# Patient Record
Sex: Female | Born: 1965 | Race: White | Hispanic: No | Marital: Single | Smoking: Former smoker
Health system: Southern US, Community
[De-identification: ages and names within clinical notes are randomized; demographics above are authoritative.]

## PROBLEM LIST (undated history)

## (undated) DIAGNOSIS — A159 Respiratory tuberculosis unspecified: Secondary | ICD-10-CM

## (undated) DIAGNOSIS — L409 Psoriasis, unspecified: Secondary | ICD-10-CM

## (undated) DIAGNOSIS — E039 Hypothyroidism, unspecified: Secondary | ICD-10-CM

## (undated) DIAGNOSIS — R600 Localized edema: Secondary | ICD-10-CM

## (undated) DIAGNOSIS — R5383 Other fatigue: Secondary | ICD-10-CM

## (undated) DIAGNOSIS — J449 Chronic obstructive pulmonary disease, unspecified: Secondary | ICD-10-CM

## (undated) DIAGNOSIS — M2550 Pain in unspecified joint: Secondary | ICD-10-CM

## (undated) DIAGNOSIS — I471 Supraventricular tachycardia, unspecified: Secondary | ICD-10-CM

## (undated) DIAGNOSIS — E785 Hyperlipidemia, unspecified: Secondary | ICD-10-CM

## (undated) DIAGNOSIS — M1990 Unspecified osteoarthritis, unspecified site: Secondary | ICD-10-CM

## (undated) HISTORY — DX: Supraventricular tachycardia: I47.1

## (undated) HISTORY — PX: LUMBAR DISC SURGERY: SHX700

## (undated) HISTORY — DX: Psoriasis, unspecified: L40.9

## (undated) HISTORY — PX: COLONOSCOPY: SHX174

## (undated) HISTORY — DX: Other fatigue: R53.83

## (undated) HISTORY — DX: Supraventricular tachycardia, unspecified: I47.10

## (undated) HISTORY — DX: Hyperlipidemia, unspecified: E78.5

## (undated) HISTORY — DX: Localized edema: R60.0

## (undated) HISTORY — PX: OTHER SURGICAL HISTORY: SHX169

## (undated) HISTORY — PX: ENDOMETRIAL ABLATION: SHX621

## (undated) HISTORY — PX: ANKLE SURGERY: SHX546

## (undated) HISTORY — DX: Pain in unspecified joint: M25.50

---

## 2005-05-25 ENCOUNTER — Ambulatory Visit: Payer: Self-pay | Admitting: Anesthesiology

## 2005-05-26 ENCOUNTER — Ambulatory Visit: Payer: Self-pay | Admitting: Anesthesiology

## 2005-07-02 ENCOUNTER — Ambulatory Visit: Payer: Self-pay | Admitting: Anesthesiology

## 2005-08-27 ENCOUNTER — Ambulatory Visit: Payer: Self-pay | Admitting: Anesthesiology

## 2005-10-28 ENCOUNTER — Ambulatory Visit (HOSPITAL_COMMUNITY): Admission: RE | Admit: 2005-10-28 | Discharge: 2005-10-29 | Payer: Self-pay | Admitting: Neurosurgery

## 2008-04-18 ENCOUNTER — Encounter: Admission: RE | Admit: 2008-04-18 | Discharge: 2008-04-18 | Payer: Self-pay | Admitting: Internal Medicine

## 2010-08-03 ENCOUNTER — Encounter: Payer: Self-pay | Admitting: Internal Medicine

## 2010-11-28 NOTE — Op Note (Signed)
Cynthia Obrien, Cynthia Obrien              ACCOUNT NO.:  [id number]   MEDICAL RECORD NO.:  [id number]          PATIENT TYPE:  OIB   LOCATION:  3014                         FACILITY:  [facility]   PHYSICIAN:  Cristi Loron, M.D.DATE OF BIRTH:  02/17/1966   DATE OF PROCEDURE:  10/29/2005  DATE OF DISCHARGE:  10/29/2005                                 OPERATIVE REPORT   BRIEF HISTORY:  The patient is a 45 year old white female who has suffered  from back and left leg pain.  She failed medical management and was worked  up with a lumbar MRI, which demonstrated a herniated disk at L5-S1 on the  left.  I discussed the various treatment options with the patient including  surgery.  The patient has weighed the risks, benefits, and alternatives of  surgery and decided to proceed with a left L5-S1 microdiskectomy.   PREOPERATIVE DIAGNOSES:  L5-S1 herniated nucleus pulposus, stenosis,  degenerative disk disease, lumbar radiculopathy and lumbago.   POSTOPERATIVE DIAGNOSES:  L5-S1 herniated nucleus pulposus, stenosis,  degenerative disk disease, lumbar radiculopathy and lumbago.   PROCEDURE:  Left L5-S1 microdiskectomy using microdissection.   SURGEON:  Cristi Loron, M.D.   ASSISTANT:  Hewitt Shorts, M.D.   ANESTHESIA:  General endotracheal.   ESTIMATED BLOOD LOSS:  50 mL.   SPECIMENS:  None.   DRAINS:  None.   COMPLICATIONS:  None.   DESCRIPTION OF PROCEDURE:  The patient was brought to the operating room by  the anesthesia team and general endotracheal anesthesia was induced.  The  patient was turned to the prone position on the Wilson frame.  Her  lumbosacral region was then prepared with Betadine scrub and Betadine  solution and sterile drapes were applied.  I then injected the area to be  incised with Marcaine with epinephrine solution.  I used a scalpel to make a  linear midline incision over the L5-S1 interspace.  I used electrocautery to  perform a left-sided  subperiosteal dissection, exposing the spinous  processes and lamina of L5 and S1.  We then obtained the intraoperative  radiograph to confirm our location.  We then inserted the [facility]  retractor for exposure and then we brought the operating microscope into the  field and under its magnification and illumination, we completed the  microdissection/decompression.  We used the high-speed drill to perform a  left L5 laminotomy.  I widened the laminotomy with the Kerrison punch,  removing the left L5-S1 ligamentum flavum.  I performed a foraminotomy about  the left S1 nerve root and then used microdissection to free up the nerve  root from the epidural tissue and then Dr. Newell Coral carefully retracted the  left S1 nerve root and thecal sac medially, exposing the underlying  herniated disk.  There was a free-fragment disk herniation which had  migrated caudally from the disk space.  I used microdissection to free up  this disk fragment and then removed it using pituitary forceps.  We then  inspected the intervertebral disk and noted there was bulging and there was  a midline bone spur, but there was no disk herniation  per se, and we then  palpated along the ventral surface of the thecal sac along the exit route of  the left S1 nerve root and noted that the neural structures were well-  decompressed.  We therefore did not violate the annulus fibrosus nor enter  into the intervertebral disk space.  I then obtained stringent hemostasis  using bipolar electrocautery, irrigated the wound out with bacitracin  solution and then removed the McCullough retractor.  We then reapproximated  the patient's thoracolumbar fascia with interrupted #1 Vicryl suture, the  subcutaneous tissue with interrupted 2-0 Vicryl suture, and the skin with  Steri-Strips and Benzoin.  The wound was then coated with bacitracin  ointment and a sterile dressing applied.  The drapes were removed and the  patient was  subsequently returned to a supine position, where she was  extubated by the anesthesia team and transported to the postanesthesia care  unit in stable condition.  All sponge, instrument and needle counts were  correct at the end of this case.      Cristi Loron, M.D.  Electronically Signed     JDJ/MEDQ  D:  10/29/2005  T:  10/30/2005  Job:  [id number]

## 2010-11-28 NOTE — Op Note (Signed)
NAMEAMARA, Cynthia Obrien              ACCOUNT NO.:  [id number]   MEDICAL RECORD NO.:  [id number]          PATIENT TYPE:  OIB   LOCATION:  3014                         FACILITY:  [facility]   PHYSICIAN:  Cristi Loron, M.D.DATE OF BIRTH:  [birth year]   DATE OF PROCEDURE:  DATE OF DISCHARGE:  10/29/2005                                 OPERATIVE REPORT   ADDENDUM:  We did incise the annulus fibrosis and performed a partial intervertebral  diskectomy using pituitary forceps and  curettes.  After we were satisfied  with the intervertebral diskectomy, we then palpated long the ventral  surface of the thecal sac and along the exit route  of the L5 and S1 nerve  root and noted they were well decompressed.      Cristi Loron, M.D.  Electronically Signed     JDJ/MEDQ  D:  10/29/2005  T:  10/30/2005  Job:  [id number]

## 2011-09-29 ENCOUNTER — Encounter (HOSPITAL_COMMUNITY): Payer: Self-pay | Admitting: Emergency Medicine

## 2011-09-29 ENCOUNTER — Emergency Department (HOSPITAL_COMMUNITY): Payer: 59

## 2011-09-29 ENCOUNTER — Other Ambulatory Visit: Payer: Self-pay

## 2011-09-29 ENCOUNTER — Emergency Department (HOSPITAL_COMMUNITY)
Admission: EM | Admit: 2011-09-29 | Discharge: 2011-09-29 | Disposition: A | Discharge status: Home Health | Payer: 59 | Attending: Emergency Medicine | Admitting: Emergency Medicine

## 2011-09-29 DIAGNOSIS — I498 Other specified cardiac arrhythmias: Secondary | ICD-10-CM | POA: Insufficient documentation

## 2011-09-29 DIAGNOSIS — J4489 Other specified chronic obstructive pulmonary disease: Secondary | ICD-10-CM | POA: Insufficient documentation

## 2011-09-29 DIAGNOSIS — M7989 Other specified soft tissue disorders: Secondary | ICD-10-CM | POA: Insufficient documentation

## 2011-09-29 DIAGNOSIS — Z79899 Other long term (current) drug therapy: Secondary | ICD-10-CM | POA: Insufficient documentation

## 2011-09-29 DIAGNOSIS — R079 Chest pain, unspecified: Secondary | ICD-10-CM | POA: Insufficient documentation

## 2011-09-29 DIAGNOSIS — Z7982 Long term (current) use of aspirin: Secondary | ICD-10-CM | POA: Insufficient documentation

## 2011-09-29 DIAGNOSIS — R0602 Shortness of breath: Secondary | ICD-10-CM | POA: Insufficient documentation

## 2011-09-29 DIAGNOSIS — R5381 Other malaise: Secondary | ICD-10-CM | POA: Insufficient documentation

## 2011-09-29 DIAGNOSIS — I471 Supraventricular tachycardia: Secondary | ICD-10-CM

## 2011-09-29 DIAGNOSIS — J449 Chronic obstructive pulmonary disease, unspecified: Secondary | ICD-10-CM | POA: Insufficient documentation

## 2011-09-29 DIAGNOSIS — R002 Palpitations: Secondary | ICD-10-CM | POA: Insufficient documentation

## 2011-09-29 HISTORY — DX: Hypothyroidism, unspecified: E03.9

## 2011-09-29 HISTORY — DX: Chronic obstructive pulmonary disease, unspecified: J44.9

## 2011-09-29 LAB — CBC
Hemoglobin: 14.6 g/dL (ref 12.0–15.0)
MCH: 32.9 pg (ref 26.0–34.0)
MCHC: 34.1 g/dL (ref 30.0–36.0)
Platelets: 269 10*3/uL (ref 150–400)

## 2011-09-29 LAB — DIFFERENTIAL
Basophils Absolute: 0 10*3/uL (ref 0.0–0.1)
Basophils Relative: 0 % (ref 0–1)
Eosinophils Absolute: 0.1 10*3/uL (ref 0.0–0.7)
Neutro Abs: 4.9 10*3/uL (ref 1.7–7.7)
Neutrophils Relative %: 68 % (ref 43–77)

## 2011-09-29 LAB — TROPONIN I
Troponin I: <0.3 ng/mL (ref <0.30)
Troponin I: <0.3 ng/mL (ref <0.30)

## 2011-09-29 LAB — BASIC METABOLIC PANEL
Chloride: 104 mEq/L (ref 96–112)
GFR calc Af Amer: 83 mL/min — ABNORMAL LOW (ref >90)
GFR calc non Af Amer: 72 mL/min — ABNORMAL LOW (ref >90)
Potassium: 3.9 mEq/L (ref 3.5–5.1)
Sodium: 141 mEq/L (ref 135–145)

## 2011-09-29 MED ORDER — ASPIRIN 81 MG PO CHEW
324.0000 mg | CHEWABLE_TABLET | Freq: Once | ORAL | Status: AC
  Administered 2011-09-29: 243 mg via ORAL
  Filled 2011-09-29: qty 3

## 2011-09-29 MED ORDER — ADENOSINE 6 MG/2ML IV SOLN
6.0000 mg | Freq: Once | INTRAVENOUS | Status: AC
  Administered 2011-09-29: 6 mg via INTRAVENOUS

## 2011-09-29 MED ORDER — NITROGLYCERIN 0.4 MG SL SUBL
0.4000 mg | SUBLINGUAL_TABLET | SUBLINGUAL | Status: DC | PRN
  Administered 2011-09-29 (×2): 0.4 mg via SUBLINGUAL

## 2011-09-29 MED ORDER — SODIUM CHLORIDE 0.9 % IV BOLUS (SEPSIS)
1000.0000 mL | Freq: Once | INTRAVENOUS | Status: AC
  Administered 2011-09-29: 1000 mL via INTRAVENOUS

## 2011-09-29 MED ORDER — ADENOSINE 6 MG/2ML IV SOLN
INTRAVENOUS | Status: AC
  Filled 2011-09-29: qty 6

## 2011-09-29 NOTE — ED Notes (Signed)
Pt placed on on cardiac monitor and zole pads in place. Pt reports that she is still having pain in her chest over the left breast and rates at 3/10.

## 2011-09-29 NOTE — Discharge Instructions (Signed)
Ms Arie[facility] cardiology with call you in the AM with an appointment time.  Return to ER for any chest pain or other concerns for rapid heart rate.    Arrhythmia Categories Your heart is a muscle that works to pump blood through your body by regular contractions. The beating of your heart is controlled by a system of special pacemaker cells. These cells control the electrical activity of the heart. When the system controlling this regular beating is disturbed, a heart rhythm abnormality (arrhythmia) results. WHEN YOUR HEART SKIPS A BEAT One of the most common and least serious heart arrhythmias is called an ectopic or premature atrial heartbeat (PAC). This may be noticed as a small change in your regular pulse. A PAC originates from the top part (atrium) of the heart. Within the right atrium, the SA node is the area that normally controls the regularity of the heart. PACs occur in heart tissue outside of the SA node region. You may feel this as a skipped beat or heart flutter, especially if several occur in succession or occur frequently.  Another arrhythmia is ventricular premature complex (VCP or PVC). These extra beats start out in the bottom, more muscular chambers of the heart. In most cases a PVC is harmless. If there are underlying causes that are making the heart irritable such as an overactive thyroid or a prior heart attack PVCs may be of more concern. In a few cases, medications to control the heart rhythm may be prescribed. Things to try at home:  Cut down or avoid alcohol, tobacco and caffeine.   Get enough sleep.   Reduce stress.   Exercise more.  WHEN THE HEART BEATS TOO FAST Atrial tachycardia is a fast heart rate, which starts out in the atrium. It may last from minutes to much longer. Your heart may beat 140 to 240 times per minute instead of the normal 60 to 100.  Symptoms include a worried feeling (anxiety) and a sense that your heart is beating fast and hard.   You may  be able to stop the fast rate by holding your breath or bearing down as if you were going to have a bowel movement.   This type of fast rate is usually not dangerous.  Atrial fibrillation and atrial flutter are other fast rhythms that start in the atria. Both conditions keep the atria from filling with enough blood so the heart does not work well.  Symptoms include feeling lightheaded or faint.   These fast rates may be the result of heart damage or disease. Too much thyroid hormone may play a role.   There may be no clear cause or it may be from heart disease or damage.   Medication or a special electrical treatment (cardioversion) may be needed to get the heart beating normally.  Ventricular tachycardia is a fast heart rate that starts in the lower muscular chambers (ventricles) This is a serious disorder that requires treatment as soon as possible. You need someone else to get and use a small defibrillator.  Symptoms include collapse, chest pain, or being short of breath.   Treatment may include medication, procedures to improve blood flow to the heart, or an implantable cardiac defibrillator (ICD).  DIAGNOSIS   A cardiogram (EKG or ECG) will be done to see the arrhythmia, as well as lab tests to check the underlying cause.   If the extra beats or fast rate come and go, you may wear a Holter monitor that records your heart  rate for a longer period of time.  HOME CARE INSTRUCTIONS  If your caregiver has given you a follow-up appointment, it is very important to keep that appointment. Not keeping the appointment could result in a heart attack, permanent injury, pain, and disability. If there is any problem keeping the appointment, you must call back to this facility for assistance.  SEEK MEDICAL CARE IF:  You have irregular or fast heartbeats (palpitations).   You experience skipped beats.   You develop lightheadedness.   You have chest discomfort.   You develop shortness of  breath.   You have more frequent episodes, if you are already being treated.  SEEK IMMEDIATE MEDICAL CARE IF:   You have severe chest pain, especially if the pain is crushing or pressure-like and spreads to the arms, back, neck, or jaw, or if you have sweating, feeling sick to your stomach (nausea), or shortness of breath. THIS IS AN EMERGENCY. Do not wait to see if the pain will go away. Get medical help at once. Call 911 or 0 (operator). DO NOT drive yourself to the hospital.   You feel dizzy or faint.   You have episodes of previously documented atrial tachycardia that do not resolve with the techniques your caregiver has taught you.   Irregular or rapid heartbeats begin to occur more often than in the past, especially if they are associated with more pronounced symptoms or of longer duration.  Document Released: 06/29/2005 Document Revised: 06/18/2011 Document Reviewed: 11/11/2006 [facility] Patient Information 2012 [place], [place].Cardiac Ablation Cardiac means related to the heart. Ablation means destruction. Cardiac ablation is a procedure to disable a small amount of heart tissue in very specific places. The heart has many electrical connections. Sometimes these connections can become abnormal and can cause the heart to beat very fast or irregularly. By disabling some of the problem areas, the heart rhythm can be improved or made normal. Ablation is done for people who:  Have Wolf-Parkinson-White syndrome.   Have other fast heart rhythms known as tachycardia.   Have tried medications for an abnormal heart rhythm that resulted in:   No success.   Side effects.   May have a high risk heart beat (arrhythmia) that could result in death.  LET YOUR CAREGIVER KNOW ABOUT:   All previous heart procedures, such as:   Surgeries.   Tests.   Heart conditions.   Allergies or previous reactions to:   Food.   Medicine.   Tape.  RISKS AND COMPLICATIONS  Depending on how long it  takes to do the ablation, the dose of radiation can be high. This can increase the risk of cancer.   Bruising and bleeding where the catheter was inserted.   Bleeding into the chest, especially into the sack that surrounds the heart, is a serious complication that sometimes occurs.   A permanent pacemaker may be needed if the normal electrical system is damaged.   The procedure may not be fully effective, and this may not be recognized for months. Repeat ablations are sometimes required.  BEFORE THE PROCEDURE   Let your caregiver know if:   You have an allergy to X-ray dye or seafood.   If you have kidney failure.   If you have had a kidney transplant.   Follow your caregiver's instructions regarding eating and drinking before the procedure.   Take your medications as directed at regular times with water unless instructed otherwise. If you are on insulin, ask how you are to take it  and if there are special instructions. It is common to adjust insulin dosing the day of the ablation.  PROCEDURE  An ablation is usually performed in a catheterization laboratory with the guidance of fluoroscopy. Fluoroscopy is a type of X-ray that helps your caregiver see images of your heart during the procedure.   An IV will be started before the procedure begins. You will be given a sedative to help you relax.   An ablation is a minimally invasive procedure. This means a small cut (incision) is made in either your neck or groin. Your caregiver will decide where to make the incision based on your medical history and physical exam.   The skin on your neck or groin will be numbed. A needle will be inserted into a large vein in your neck or groin and a thin, flexible tube called a catheter will be threaded to your heart.   A special dye that shows up on fluoroscopy pictures may be injected through the catheter. The dye helps your caregiver see the area of the heart that needs treatment.   The catheter has  electrodes on the tip. When the area of heart tissue that is causing the abnormal heart rhythm is found, the catheter tip will send an electrical current to the area and "scar" the tissue.   Three types of energy can be used to ablate the heart tissue:   Heat (radiofrequency energy).   Laser energy.   Extreme cold (cryoablation).   When the area of the heart has been ablated, the catheter will be taken out. Pressure will be held on the insertion site. This will help the insertion site clot and keep it from bleeding. A bandage will be placed on the insertion site.   An ablation procedure can take 1 to 6 hours to complete.  AFTER THE PROCEDURE   After the procedure, you will be taken to a recovery area where your vital signs (blood pressure, heart rate and breathing) will be monitored. The insertion site will also be monitored for bleeding.   You will need to lie still for 4 to 6 hours. This is to ensure you do not bleed from the catheter insertion site.   If your blood pressure and heart rate are stable and no bleeding occurs at the insertion site, you may go home the same day as your procedure.   If complications occur or your caregiver feels you should be watched, you may need to stay in the hospital overnight.  HOME CARE INSTRUCTIONS   The day following the procedure, the bandage can be removed. If the insertion site oozes a small amount of blood, such as a few teaspoons (15 ml), place a clean bandage over the insertion site.   Ask your caregiver when you may shower.   Do not submerge the insertion site in water. This means do not sit in a bathtub, hot tub or swimming pool for 5 days or as instructed by your caregiver.  SEEK MEDICAL CARE IF:   There is swelling larger than a walnut or half a lemon at the insertion site.   You develop an oral temperature of more than 100.5 F (38.1 C).   The insertion site:   Becomes red and swollen.   Is Painful.   Has drainage that is  tan, yellow or green in color.  SEEK IMMEDIATE MEDICAL CARE IF:   Bleeding from the insertion site does not stop. Hold pressure to this area. Call your local emergency service (  911 in the [place]) immediately!   If your insertion site was in your groin, and the leg that has the insertion site becomes:   Blue.   Pale and/or cold.   Numb.   You develop chest pain that is crushing or pressure-like.   You have difficulty breathing or shortness of breath.   You develop an oral temperature of more than 101 F (38.3 C).  MAKE SURE YOU:   Understand these instructions.   Will watch your condition.   Will get help right away if you are not doing well or get worse.  Document Released: 11/15/2008 Document Revised: 06/18/2011 Document Reviewed: 11/15/2008 [facility] Patient Information 2012 [place], [place].

## 2011-09-29 NOTE — ED Notes (Signed)
Pt given 6mg  adeosine, noted to convert to NSR with medication. Pt reports that she is feeling better at this time. Decrease in chest pain. EDP at the bedside talking with pt. VSS. Continuing to monitor.

## 2011-09-29 NOTE — ED Provider Notes (Signed)
I saw and evaluated the patient, reviewed the resident's note and I agree with the findings and plan.   Dione Booze, MD 09/29/11 [phone]

## 2011-09-29 NOTE — ED Provider Notes (Addendum)
46 year old female came in with palpitations which had failed to respond to vagal maneuvers at home and in her doctor's office. She was noted to be in an SVT with a rate of about 180 beats per minute which responded well to IV adenosine. She did have some chest pain, so she will be observed to make sure that she does not have a troponin leak.  Dione Booze, MD 09/29/11 1643   Date: 09/29/2011 1319  Rate: 193  Rhythm: supraventricular tachycardia (SVT)  QRS Axis: normal  Intervals: normal  ST/T Wave abnormalities: nonspecific ST changes  Conduction Disutrbances:none  Narrative Interpretation: Supraventricular tachycardia with minor ST changes which are probably rate related. Old ECG available for comparison.  Old EKG Reviewed: none available   Date: 09/29/2011 1347  Rate: 111  Rhythm: sinus tachycardia  QRS Axis: normal  Intervals: normal  ST/T Wave abnormalities: normal  Conduction Disutrbances:none  Narrative Interpretation: Sinus tachycardia, otherwise normal ECG. When compared with ECG of earlier today, sinus tachycardia is replaced to supraventricular tachycardia.  Old EKG Reviewed: changes noted    Dione Booze, MD 09/29/11 [phone]

## 2011-09-29 NOTE — ED Notes (Signed)
On and off fast heart rate and cp left side of pain since 3 am

## 2011-09-29 NOTE — ED Provider Notes (Signed)
Rosanne Ashing is seen primarily in the emergency department. I continue to oversee her care in the CDU by Ms. Darol Destine, MD 09/29/11 [phone]

## 2011-09-29 NOTE — ED Provider Notes (Signed)
1500:  Report received from [place].  Awaiting third troponin at 1700.  R/o chest pain that is constant since last pm.  Will follow up with cardiology if negative. No further pain in the ER.   1800 No further chest pain.  Troponin negative x 2 after SVT resolved in the ER.  Spoke with Bjorn Loser PA for [employer] .  Will call patient in am for appointment time.  No meds at discharge.  No chest pain.  NSR.  Patient agrees and is ready for discharge.   Jethro Bastos, NP 09/30/11 1320

## 2011-09-29 NOTE — ED Provider Notes (Signed)
History     CSN: [id number]  Arrival date & time 09/29/11  1315   First MD Initiated Contact with Patient 09/29/11 1341      Chief Complaint  Patient presents with  . Chest Pain    (Consider location/radiation/quality/duration/timing/severity/associated sxs/prior treatment) Patient is a 46 y.o. female presenting with palpitations. The history is provided by the patient.  Palpitations  This is a new problem. The current episode started 2 days ago. Episode frequency: intermittently. The problem has been rapidly worsening. The problem is associated with an unknown factor. Associated symptoms include chest pain, chest pressure, weakness and shortness of breath. Pertinent negatives include no diaphoresis, no fever, no abdominal pain, no nausea, no vomiting, no headaches and no lower extremity edema. Treatments tried: vagal maneuvers. Past medical history comments: hypothyroidism.    Past Medical History  Diagnosis Date  . COPD (chronic obstructive pulmonary disease)   . Thyroid disease     No past surgical history on file.  No family history on file.  History  Substance Use Topics  . Smoking status: Not on file  . Smokeless tobacco: Not on file  . Alcohol Use:     OB History    Grav Para Term Preterm Abortions TAB SAB Ect Mult Living                  Review of Systems  Constitutional: Negative for fever, chills and diaphoresis.  HENT: Negative for congestion and rhinorrhea.   Respiratory: Positive for shortness of breath.   Cardiovascular: Positive for chest pain, palpitations and leg swelling (not new or worsening).  Gastrointestinal: Negative for nausea, vomiting, abdominal pain and constipation.  Genitourinary: Negative for urgency, decreased urine volume and difficulty urinating.  Skin: Negative for wound.  Neurological: Positive for weakness. Negative for headaches.  Psychiatric/Behavioral: Negative.   All other systems reviewed and are negative.    Allergies    Review of patient's allergies indicates no known allergies.  Home Medications   Current Outpatient Rx  Name Route Sig Dispense Refill  . ASPIRIN 81 MG PO CHEW Oral Chew 81 mg by mouth daily.    . FUROSEMIDE 40 MG PO TABS Oral Take 40 mg by mouth daily.    Marland Kitchen LEVOTHYROXINE SODIUM 50 MCG PO CAPS Oral Take 1 capsule by mouth daily.    Marland Kitchen LIOTHYRONINE SODIUM 25 MCG PO TABS Oral Take 25 mcg by mouth daily.    Marland Kitchen PHENTERMINE HCL 37.5 MG PO CAPS Oral Take 37.5 mg by mouth every morning.    Marland Kitchen PRAVASTATIN SODIUM 40 MG PO TABS Oral Take 40 mg by mouth daily.      BP 118/79  Pulse 103  Temp(Src) 97.7 F (36.5 C) (Oral)  Resp 22  SpO2 98%  Physical Exam  Nursing note and vitals reviewed. Constitutional: She is oriented to person, place, and time. She appears well-developed and well-nourished. No distress.  HENT:  Head: Normocephalic and atraumatic.  Right Ear: External ear normal.  Left Ear: External ear normal.  Nose: Nose normal.  Mouth/Throat: Oropharynx is clear and moist.  Neck: Neck supple.  Cardiovascular: Regular rhythm, normal heart sounds and intact distal pulses.  Tachycardia present.   No murmur heard. Pulmonary/Chest: Effort normal and breath sounds normal. No respiratory distress. She has no wheezes. She has no rales.  Abdominal: Soft. She exhibits no distension. There is no tenderness.  Musculoskeletal: She exhibits no edema.  Lymphadenopathy:    She has no cervical adenopathy.  Neurological: She is alert  and oriented to person, place, and time.  Skin: Skin is warm and dry. She is not diaphoretic. No pallor.    ED Course  Procedures (including critical care time)  Labs Reviewed  BASIC METABOLIC PANEL - Abnormal; Notable for the following:    GFR calc non Af Amer 72 (*)    GFR calc Af Amer 83 (*)    All other components within normal limits  CBC  DIFFERENTIAL  TROPONIN I  TROPONIN I   Dg Chest Portable 1 View  09/29/2011  *RADIOLOGY REPORT*  Clinical Data:  46 year old female with chest pain.  PORTABLE CHEST - 1 VIEW  Comparison: 10/26/2005  Findings: The cardiomediastinal silhouette is unremarkable. There is no evidence of focal airspace disease, pulmonary edema, suspicious pulmonary nodule/mass, pleural effusion, or pneumothorax. No acute bony abnormalities are identified.  IMPRESSION: No evidence of acute cardiopulmonary disease.  Original Report Authenticated By: Rosendo Gros, M.D.     Date: 09/29/2011  Rate: 193  Rhythm: supraventricular tachycardia (SVT)  QRS Axis: normal  Intervals: normal  ST/T Wave abnormalities: normal  Conduction Disutrbances:none  Narrative Interpretation:   Old EKG Reviewed: none available   Date: 09/29/2011  Rate: 111  Rhythm: sinus tachycardia  QRS Axis: normal  Intervals: normal  ST/T Wave abnormalities: normal  Conduction Disutrbances:none  Narrative Interpretation: SVT now resolved  Old EKG Reviewed: changes noted     1. SVT (supraventricular tachycardia)       MDM  46 yo female with hx of COPD and thyroid disease presents in SVT. Palpitations intermittently for a few days, now having chest pain, dyspnea and HR in 190s. SVT on EKG. At PCP's vagal maneuvers unsuccessful. 6 mg adenosine relieved her symptoms and now in sinus tach. CP gone. Will do 3 hour troponin. Doubt ACS. Doubt PE as symptoms likely just explained by SVT. Care transferred to PA Crawford in CDU with tentative plan of 3 hour troponin assuming patient remains pain free.        Pricilla Loveless, MD 09/29/11 [phone]

## 2011-09-29 NOTE — ED Provider Notes (Signed)
3:00 PM Patient to move to CDU holding for 3 hr troponin.  Sign out received from Pricilla Loveless, Emergency Medicine Resident (blue side, with Dr Preston Fleeting).  Pt p/w SVTs, converted with 6mg  adenosine, at the time she had chest pain.  Plan is for d/c home with PCP and cardiology follow up if the troponin is negative and she is chest pain free at the time.  Patient signed out to Levy Sjogren, NP, who assumes care of patient upon arrival to CDU.    Cynthia Obrien, [place] 09/29/11 1515

## 2011-09-29 NOTE — Progress Notes (Signed)
Asked to make a f/u appt with cardiology. Left msg with office to call her in am.

## 2011-10-01 ENCOUNTER — Ambulatory Visit (INDEPENDENT_AMBULATORY_CARE_PROVIDER_SITE_OTHER): Payer: 59 | Admitting: Cardiology

## 2011-10-01 ENCOUNTER — Encounter: Payer: Self-pay | Admitting: Cardiology

## 2011-10-01 VITALS — BP 136/88 | HR 86 | Ht 65.0 in | Wt 254.0 lb

## 2011-10-01 DIAGNOSIS — I498 Other specified cardiac arrhythmias: Secondary | ICD-10-CM

## 2011-10-01 DIAGNOSIS — I471 Supraventricular tachycardia: Secondary | ICD-10-CM

## 2011-10-01 DIAGNOSIS — E785 Hyperlipidemia, unspecified: Secondary | ICD-10-CM

## 2011-10-01 MED ORDER — METOPROLOL SUCCINATE ER 25 MG PO TB24: 25.0000 mg | ORAL_TABLET | Freq: Every day | ORAL | Status: DC

## 2011-10-01 NOTE — Assessment & Plan Note (Signed)
Patient's electrocardiogram from the hospital reviewed and demonstrates SVT. She feels her thyroid may be contributing. She has had problems adjusting her medications. Plan check TSH. Check echocardiogram. Add Toprol 25 mg daily. If symptoms persist despite medications referred to electrophysiology for consideration of ablation.

## 2011-10-01 NOTE — Progress Notes (Signed)
  HPI: 46 year old female for evaluation of SVT. Seen in the emergency room March 19 with palpitations. Her electrocardiogram revealed SVT which broke with adenosine. Patient states she had an episode similar to this 9 years ago in [place]. She had an echo and had no further symptoms until this past fall. Over the past 3 weeks she has had more frequent episodes. The episode 2 days ago was the longest and the only one that required adenosine. She describes sudden onset of heart racing followed by tightness in her chest. No shortness of breath or syncope. Otherwise no dyspnea on exertion, orthopnea, PND, exertional chest pain. No history of syncope.  Current Outpatient Prescriptions  Medication Sig Dispense Refill  . aspirin 81 MG chewable tablet Chew 81 mg by mouth daily.      . furosemide (LASIX) 40 MG tablet Take 40 mg by mouth daily.      . Levothyroxine Sodium (TIROSINT) 50 MCG CAPS Take 1 capsule by mouth daily.      Marland Kitchen liothyronine (CYTOMEL) 25 MCG tablet Take 25 mcg by mouth daily.      . phentermine 37.5 MG capsule Take 37.5 mg by mouth every morning.      . pravastatin (PRAVACHOL) 40 MG tablet Take 40 mg by mouth daily.        Not on File  Past Medical History  Diagnosis Date  . Hypothyroidism   . Hyperlipidemia     Past Surgical History  Procedure Date  . Lumbar disc surgery   . Ankle surgery   . Tonsillectomy     History   Social History  . Marital Status: Single    Spouse Name: N/A    Number of Children: N/A  . Years of Education: N/A   Occupational History  .      Quality engineer   Social History Main Topics  . Smoking status: Former Games developer  . Smokeless tobacco: Not on file  . Alcohol Use: Yes     Several glasse of wine per night  . Drug Use: Not on file  . Sexually Active: Not on file   Other Topics Concern  . Not on file   Social History Narrative  . No narrative on file    Family History  Problem Relation Age of Onset  . Heart disease Mother     MV repair; ICD    ROS: no fevers or chills, productive cough, hemoptysis, dysphasia, odynophagia, melena, hematochezia, dysuria, hematuria, rash, seizure activity, orthopnea, PND, pedal edema, claudication. Remaining systems are negative.  Physical Exam:   Blood pressure 136/88, pulse 86, height 5\' 5"  (1.651 m), weight 254 lb (115.214 kg).  General:  Well developed/obese in NAD Skin warm/dry Patient not depressed No peripheral clubbing Back-normal HEENT-normal/normal eyelids Neck supple/normal carotid upstroke bilaterally; no bruits; no JVD; no thyromegaly chest - CTA/ normal expansion CV - RRR/normal S1 and S2; no murmurs, rubs or gallops;  PMI nondisplaced Abdomen -NT/ND, no HSM, no mass, + bowel sounds, no bruit 2+ femoral pulses, no bruits Ext-no edema, chords, 2+ DP Neuro-grossly nonfocal  ECG 09/29/2011-supraventricular tachycardia at a rate of 193. No ST changes.  Today's electrocardiogram shows sinus rhythm, right axis deviation, no delta wave.

## 2011-10-01 NOTE — ED Provider Notes (Signed)
Medical screening examination/treatment/procedure(s) were performed by non-physician practitioner and as supervising physician I was immediately available for consultation/collaboration.   Dione Booze, MD 10/01/11 1440

## 2011-10-01 NOTE — Assessment & Plan Note (Signed)
Management per primary care. 

## 2011-10-01 NOTE — Patient Instructions (Signed)
Your physician recommends that you schedule a follow-up appointment in:  4-6 WEEKS  Your physician has requested that you have an echocardiogram. Echocardiography is a painless test that uses sound waves to create images of your heart. It provides your doctor with information about the size and shape of your heart and how well your heart's chambers and valves are working. This procedure takes approximately one hour. There are no restrictions for this procedure.   Your physician recommends that you return for lab work in: TODAY  START METOPROLOL SUCC 25 MG ONCE DAILY

## 2011-10-02 LAB — TSH: TSH: 0.58 u[IU]/mL (ref 0.35–5.50)

## 2011-10-15 ENCOUNTER — Other Ambulatory Visit: Payer: Self-pay

## 2011-10-15 ENCOUNTER — Ambulatory Visit (HOSPITAL_COMMUNITY): Payer: 59 | Attending: Cardiovascular Disease

## 2011-10-15 DIAGNOSIS — I471 Supraventricular tachycardia, unspecified: Secondary | ICD-10-CM

## 2011-10-15 DIAGNOSIS — Z87891 Personal history of nicotine dependence: Secondary | ICD-10-CM | POA: Insufficient documentation

## 2011-10-15 DIAGNOSIS — R0609 Other forms of dyspnea: Secondary | ICD-10-CM | POA: Insufficient documentation

## 2011-10-15 DIAGNOSIS — E785 Hyperlipidemia, unspecified: Secondary | ICD-10-CM | POA: Insufficient documentation

## 2011-10-15 DIAGNOSIS — R0989 Other specified symptoms and signs involving the circulatory and respiratory systems: Secondary | ICD-10-CM | POA: Insufficient documentation

## 2011-10-16 ENCOUNTER — Telehealth: Payer: Self-pay | Admitting: Cardiology

## 2011-10-16 NOTE — Telephone Encounter (Signed)
Pt thinks she is having side effects form med toprol , SOB and diarrhea, pls call

## 2011-10-16 NOTE — Telephone Encounter (Signed)
Spoke with pt, since starting the toprol she has SOB that seems to be getting a little worse and also diarrhea. She will try taking 1/2 tablet, if no change in symptoms she was given the okay to stop. Will make dr Jens Som aware

## 2011-10-16 NOTE — Telephone Encounter (Signed)
Ok  Cynthia Obrien  

## 2011-10-20 ENCOUNTER — Telehealth: Payer: Self-pay | Admitting: Cardiology

## 2011-10-20 NOTE — Telephone Encounter (Signed)
10/20/11--pt calling stating ,even tho she cut metoprol in half, she's still experiencing cramps,diarrhea and SOB--Advised to stop metoprolol and i would notify dr Jens Som and debra about her reactions and perhaps they can prescribe some other med--pt agrees and states she's going out of town on Friday , and could they call her before she leaves--nt

## 2011-10-20 NOTE — Telephone Encounter (Signed)
New msg Pt wants to talk to you about metoprolol side effects. Please call back

## 2011-10-20 NOTE — Telephone Encounter (Signed)
Dc toprol and try atenolol 25 mg daily Olga Millers

## 2011-10-22 ENCOUNTER — Telehealth: Payer: Self-pay | Admitting: *Deleted

## 2011-10-22 MED ORDER — ATENOLOL 25 MG PO TABS: 25.0000 mg | ORAL_TABLET | Freq: Every day | ORAL | Status: DC

## 2011-10-22 NOTE — Telephone Encounter (Signed)
Follow up on previous call:  Patient calling status of d/C toprol . An starting new medication.

## 2011-10-22 NOTE — Telephone Encounter (Signed)
Pt calling wanting to know what med dr Jens Som wants her to take in place of metoprolol--advised to start atenolol 25mg  1 po qd--this was called into [facility], [place] and pt made aware that med has been caaled to pharmacy--nt

## 2011-10-26 NOTE — Telephone Encounter (Signed)
Spoke with pt, aware of change. 

## 2011-11-03 ENCOUNTER — Encounter: Payer: Self-pay | Admitting: *Deleted

## 2011-11-03 ENCOUNTER — Encounter: Payer: Self-pay | Admitting: Cardiology

## 2011-11-04 ENCOUNTER — Ambulatory Visit (INDEPENDENT_AMBULATORY_CARE_PROVIDER_SITE_OTHER): Payer: 59 | Admitting: Cardiology

## 2011-11-04 ENCOUNTER — Encounter: Payer: Self-pay | Admitting: Cardiology

## 2011-11-04 ENCOUNTER — Telehealth: Payer: Self-pay | Admitting: Cardiology

## 2011-11-04 VITALS — BP 116/77 | HR 63 | Ht 65.0 in | Wt 264.0 lb

## 2011-11-04 DIAGNOSIS — R0602 Shortness of breath: Secondary | ICD-10-CM

## 2011-11-04 DIAGNOSIS — I498 Other specified cardiac arrhythmias: Secondary | ICD-10-CM

## 2011-11-04 DIAGNOSIS — E039 Hypothyroidism, unspecified: Secondary | ICD-10-CM

## 2011-11-04 DIAGNOSIS — I471 Supraventricular tachycardia: Secondary | ICD-10-CM

## 2011-11-04 DIAGNOSIS — R609 Edema, unspecified: Secondary | ICD-10-CM

## 2011-11-04 DIAGNOSIS — E785 Hyperlipidemia, unspecified: Secondary | ICD-10-CM

## 2011-11-04 NOTE — Progress Notes (Signed)
   HPI: Pleasant female for fu of SVT. Seen in the emergency room September 29, 2010 with palpitations. Her electrocardiogram revealed SVT which broke with adenosine. Patient states she had an episode similar to this 9 years ago in [place]. Echocardiogram in April of 2013 showed normal LV function and grade 2 diastolic dysfunction. TSH normal. Toprol was added at last visit in March of 2013. Since then she has noticed some dyspnea on exertion but no orthopnea or PND. She has had increased pedal edema. She has had no further episodes of palpitations. No chest pain or syncope. She has had some issues with diarrhea.   Current Outpatient Prescriptions  Medication Sig Dispense Refill  . aspirin 81 MG chewable tablet Chew 81 mg by mouth daily.      Marland Kitchen atenolol (TENORMIN) 25 MG tablet Take 1 tablet (25 mg total) by mouth daily.  30 tablet  0  . furosemide (LASIX) 40 MG tablet Take 40 mg by mouth daily.      . Levothyroxine Sodium (TIROSINT) 50 MCG CAPS Take 1 capsule by mouth daily.      Marland Kitchen liothyronine (CYTOMEL) 25 MCG tablet Take 25 mcg by mouth daily.      . pravastatin (PRAVACHOL) 40 MG tablet Take 40 mg by mouth daily.         Past Medical History  Diagnosis Date  . Hypothyroidism   . Hyperlipidemia   . SVT (supraventricular tachycardia)   . COPD (chronic obstructive pulmonary disease)     Past Surgical History  Procedure Date  . Lumbar disc surgery   . Ankle surgery   . Tonsillectomy     History   Social History  . Marital Status: Single    Spouse Name: N/A    Number of Children: N/A  . Years of Education: N/A   Occupational History  .      Quality engineer   Social History Main Topics  . Smoking status: Former Games developer  . Smokeless tobacco: Not on file  . Alcohol Use: Yes     Several glasse of wine per night  . Drug Use: Not on file  . Sexually Active: Not on file   Other Topics Concern  . Not on file   Social History Narrative  . No narrative on file    ROS:  Diarrhea but no fevers or chills, productive cough, hemoptysis, dysphasia, odynophagia, melena, hematochezia, dysuria, hematuria, rash, seizure activity, orthopnea, PND, claudication. Remaining systems are negative.  Physical Exam: Well-developed obese in no acute distress.  Skin is warm and dry.  HEENT is normal.  Neck is supple.  Chest is clear to auscultation with normal expansion.  Cardiovascular exam is regular rate and rhythm.  Abdominal exam nontender or distended. No masses palpated. Extremities show trace to 1+ edema. neuro grossly intact

## 2011-11-04 NOTE — Telephone Encounter (Signed)
Spoke with pt, referral will be made. Also will call dr moriera's office and have them send records.

## 2011-11-04 NOTE — Telephone Encounter (Signed)
Patient needs referral call into Dr. Sharl Ma  Patient was seen in the office this morning and referred to Dr. Sharl Ma [phone]). Patient was instructed to Dr. Sharl Ma for an appnt but nurse told her referring Dr. Isidore Moos would have to call to get the appnt for her.  Patient would like Nurse DM to call for an appnt and get back to her with time and date she can be reached at  [phone].

## 2011-11-04 NOTE — Assessment & Plan Note (Signed)
She has chronic pedal edema but this has worsened recently. I have asked her to increase her Lasix to 40 mg twice a day for 2 days and then resume 40 mg daily. Keep feet elevated. Check potassium, renal function and BNP in one week.

## 2011-11-04 NOTE — Assessment & Plan Note (Signed)
Symptoms are relatively well controlled with atenolol. Will continue. If more frequent episodes on beta blockade in the future we'll refer to electrophysiology for consideration of ablation.

## 2011-11-04 NOTE — Assessment & Plan Note (Signed)
She feels that some of her palpitations are related to her thyroid problems. I have given her Dr. Daune Perch name of endocrinology for evaluation.

## 2011-11-04 NOTE — Assessment & Plan Note (Signed)
Management per primary care. 

## 2011-11-04 NOTE — Patient Instructions (Signed)
Your physician wants you to follow-up in: 6 MONTHS WITH DR Jens Som You will receive a reminder letter in the mail two months in advance. If you don't receive a letter, please call our office to schedule the follow-up appointment.   TAKE FUROSEMIDE 40 MG ONCE DAILY X 2 DAYS  Your physician recommends that you return for lab work in: ONE WEEK  DR JEFF KERR=[phone]

## 2011-11-12 ENCOUNTER — Other Ambulatory Visit (INDEPENDENT_AMBULATORY_CARE_PROVIDER_SITE_OTHER): Payer: 59 | Admitting: *Deleted

## 2011-11-12 ENCOUNTER — Other Ambulatory Visit: Payer: 59

## 2011-11-12 ENCOUNTER — Telehealth: Payer: Self-pay | Admitting: Cardiology

## 2011-11-12 DIAGNOSIS — R0989 Other specified symptoms and signs involving the circulatory and respiratory systems: Secondary | ICD-10-CM

## 2011-11-12 DIAGNOSIS — R609 Edema, unspecified: Secondary | ICD-10-CM

## 2011-11-12 DIAGNOSIS — R0609 Other forms of dyspnea: Secondary | ICD-10-CM

## 2011-11-12 LAB — BASIC METABOLIC PANEL
CO2: 25 mEq/L (ref 19–32)
Calcium: 8.9 mg/dL (ref 8.4–10.5)
Creatinine, Ser: 0.9 mg/dL (ref 0.4–1.2)
GFR: 68.34 mL/min (ref >60.00)
Sodium: 140 mEq/L (ref 135–145)

## 2011-11-12 LAB — BRAIN NATRIURETIC PEPTIDE: Pro B Natriuretic peptide (BNP): 30 pg/mL (ref 0.0–100.0)

## 2011-11-12 NOTE — Telephone Encounter (Signed)
New msg Pt wants to followup about appt with Dr Sharl Ma. Please call her back

## 2011-11-12 NOTE — Telephone Encounter (Signed)
Left message for pt, paperwork faxed to dr Daune Perch office.

## 2011-11-23 ENCOUNTER — Other Ambulatory Visit: Payer: Self-pay | Admitting: Cardiology

## 2012-07-28 ENCOUNTER — Ambulatory Visit (INDEPENDENT_AMBULATORY_CARE_PROVIDER_SITE_OTHER): Payer: 59 | Admitting: Nurse Practitioner

## 2012-07-28 ENCOUNTER — Encounter: Payer: Self-pay | Admitting: Nurse Practitioner

## 2012-07-28 VITALS — BP 131/81 | HR 64 | Ht 65.0 in | Wt 300.0 lb

## 2012-07-28 DIAGNOSIS — I498 Other specified cardiac arrhythmias: Secondary | ICD-10-CM

## 2012-07-28 DIAGNOSIS — L409 Psoriasis, unspecified: Secondary | ICD-10-CM | POA: Insufficient documentation

## 2012-07-28 DIAGNOSIS — R0600 Dyspnea, unspecified: Secondary | ICD-10-CM

## 2012-07-28 DIAGNOSIS — R5381 Other malaise: Secondary | ICD-10-CM

## 2012-07-28 DIAGNOSIS — I471 Supraventricular tachycardia: Secondary | ICD-10-CM

## 2012-07-28 DIAGNOSIS — I10 Essential (primary) hypertension: Secondary | ICD-10-CM

## 2012-07-28 DIAGNOSIS — R5383 Other fatigue: Secondary | ICD-10-CM

## 2012-07-28 DIAGNOSIS — M2550 Pain in unspecified joint: Secondary | ICD-10-CM | POA: Insufficient documentation

## 2012-07-28 MED ORDER — BISOPROLOL FUMARATE 5 MG PO TABS: 2.5000 mg | ORAL_TABLET | Freq: Every day | ORAL | Status: DC

## 2012-07-28 NOTE — Patient Instructions (Addendum)
Your physician has recommended you make the following change in your medication:  STOP Bystolic.  START Bisoprolol 2.5 mg daily.  Your physician recommends that you schedule a follow-up appointment in: 6 weeks with Dr Jens Som

## 2012-07-28 NOTE — Progress Notes (Signed)
Patient Name: Cynthia Obrien Date of Encounter: 07/28/2012  Primary Care Provider:  Ralene Ok, MD Primary Cardiologist:  B. Jens Som, MD  Patient Profile  47 year old female with history of supraventricular tachycardia and chronic fatigue presents for follow-up.  Problem List   Past Medical History  Diagnosis Date  . Hypothyroidism   . Hyperlipidemia   . SVT (supraventricular tachycardia)     a. 10/2011 Echo: EF 60-65%, Gr 2 DD.  Marland Kitchen COPD (chronic obstructive pulmonary disease)   . Lower extremity edema   . Fatigue   . Joint ache   . Psoriasis    Past Surgical History  Procedure Date  . Lumbar disc surgery   . Ankle surgery   . Tonsillectomy     Allergies  Allergies  Allergen Reactions  . Amoxicillin     HPI  47 year old female with the above problem list.  She was last seen in clinic just under one year ago.  She has a history of supraventricular tachycardia that previously broke with adenosine in the emergency room in 2012.  She has been managed with beta blocker therapy ever since.  Initially she reports that she was on Toprol however this was discontinued secondary to fatigue and dyspnea.  Last year she was on atenolol and this too was causing fatigue and dyspnea and her primary care provider switched her to nebivolol.  Over the past year, she has gained about 60 pounds and has had persistent fatigue and mild dyspnea exertion.  She is also noted intermittent diarrhea, more frequent flares of psoriasis with greater distribution rash, and diffuse joint aching.  She felt the joints may be related to statin therapy and she discontinued it she is not sure the aching has really improving.  She apparently has had her knees evaluated by orthopedics with normal x-rays.  She says her rheumatoid factor was recently found to be normal.  She's been seen by dermatology but has not yet seen rheumatology.  She presents today for consideration of switching her beta blocker she thinks  this may be contributing to many of her symptoms.  She has had her thyroid replacement dose adjusted secondary to iatrogenic hyperthyroidism and she has not noticed any significant change in her level of fatigue or energy.  She denies PND, orthopnea, dizziness, syncope, early satiety, or chest pain.  She has occasionally had a brief palpitation over the past year but has not had any prolonged episodes of SVT as far she knows.  Home Medications  Prior to Admission medications   Medication Sig Start Date End Date Taking? Authorizing Provider  aspirin 81 MG chewable tablet Chew 81 mg by mouth daily.   Yes Historical Provider, MD  furosemide (LASIX) 40 MG tablet Take 40 mg by mouth daily.   Yes Historical Provider, MD  Levothyroxine Sodium (TIROSINT) 112 MCG CAPS Take 112 capsules by mouth daily.   Yes Historical Provider, MD  nebivolol (BYSTOLIC) 2.5 MG tablet Take 2.5 mg by mouth daily.   Yes Historical Provider, MD  Potassium 99 MG TABS Take by mouth.   Yes Historical Provider, MD  bisoprolol (ZEBETA) 5 MG tablet Take 0.5 tablets (2.5 mg total) by mouth daily. 07/28/12   Ok Anis, NP    Review of Systems  As above, she experiences baseline fatigue with dyspnea on exertion, diffuse joint aching, intermittent diarrhea, and psoriatic outbreaks.  She is sedentary and does not exercise.All other systems reviewed and are otherwise negative except as noted above.  Physical Exam  Blood pressure 131/81, pulse 64, height 5\' 5"  (1.651 m), weight 300 lb (136.079 kg).  General: Pleasant, NAD Psych: Normal affect. Neuro: Alert and oriented X 3. Moves all extremities spontaneously. HEENT: Normal  Neck: Supple without bruits or JVD. Lungs:  Resp regular and unlabored, CTA. Heart: RRR no s3, s4, or murmurs. Abdomen: Soft, non-tender, non-distended, BS + x 4.  Extremities: No clubbing, cyanosis.  Trace bilat LE edema above the sock line. DP/PT/Radials 2+ and equal bilaterally.  Accessory  Clinical Findings  ECG - sb, 57, no acute st/t changes.  Assessment & Plan  1.  Paroxysmal supraventricular tachycardia: This is actually well controlled with beta blocker therapy and she has had no recurrence of persistent SVT in several years.  Unfortunately she continues to experience fatigue on nebivolol and has had similar fatigue and dyspnea on Toprol and also atenolol.  I have switched her to bisoprolol 2.5 mg daily.  She's had extensive lab evaluation by premature provider for the past few months.  2.  Fatigue and dyspnea: This is a chronic complaint which she attributes to beta blocker therapy.  As above, we are switching her to bisoprolol to see if this will help.  3.  Hypothyroidism: Is being managed by primary care.  She has required dose adjustments over the past year.  4.  Diffuse joint pain: Per primary care.  Consider rheumatology referral given combination of fatigue, diffuse aching, and psoriasis.  5.  Morbid obesity: She is gaining a significant amount of weight over the past year in the setting of inactivity dietary noncompliance.  Encouraged her to begin an exercise regimen as weight loss may improve some of her symptoms.  6.  Disposition: Followup with Dr. Jens Som in approximately 6 weeks to reevaluate symptoms with change of beta blocker.  Nicolasa Ducking, NP 07/28/2012, 9:01 AM

## 2012-09-09 ENCOUNTER — Ambulatory Visit (INDEPENDENT_AMBULATORY_CARE_PROVIDER_SITE_OTHER): Payer: 59 | Admitting: Cardiology

## 2012-09-09 ENCOUNTER — Encounter: Payer: Self-pay | Admitting: Cardiology

## 2012-09-09 VITALS — BP 130/84 | HR 68 | Ht 65.0 in | Wt 303.0 lb

## 2012-09-09 DIAGNOSIS — I471 Supraventricular tachycardia: Secondary | ICD-10-CM

## 2012-09-09 MED ORDER — DILTIAZEM HCL ER COATED BEADS 120 MG PO CP24: 120.0000 mg | ORAL_CAPSULE | Freq: Every day | ORAL | Status: DC

## 2012-09-09 NOTE — Assessment & Plan Note (Signed)
Patient continues to complain of dyspnea and cough that she feels is related to her beta blocker. Multiple beta blockers have been tried without success. I will discontinue her bisoprolol. I will add Cardizem CD 120 mg daily. If she has worsening SVT off of beta blockers we will refer to EP for consideration of ablation.

## 2012-09-09 NOTE — Progress Notes (Signed)
   HPI: Pleasant female for fu of SVT. Seen in the emergency room September 29, 2010 with palpitations. Her electrocardiogram revealed SVT which broke with adenosine. Patient states she had an episode similar to this previously in [place]. Echocardiogram in April of 2013 showed normal LV function and grade 2 diastolic dysfunction. TSH normal. Patient did not tolerate toprol or atenolol in past. Recently changed to bisoprolol. Since then, she continues to have dyspnea and a cough that she feels is related to her beta blocker. Occasional mild pedal edema. She only has chest pain when she coughs. She has not had sustained SVT.   Current Outpatient Prescriptions  Medication Sig Dispense Refill  . aspirin 81 MG tablet Take 81 mg by mouth daily.      . bisoprolol (ZEBETA) 5 MG tablet Take 0.5 tablets (2.5 mg total) by mouth daily.  45 tablet  1  . furosemide (LASIX) 40 MG tablet Take 40 mg by mouth daily.      . Levothyroxine Sodium (TIROSINT) 112 MCG CAPS Take 112 capsules by mouth daily.      . Potassium 99 MG TABS Take by mouth.       No current facility-administered medications for this visit.     Past Medical History  Diagnosis Date  . Hypothyroidism   . Hyperlipidemia   . SVT (supraventricular tachycardia)     a. 10/2011 Echo: EF 60-65%, Gr 2 DD.  Marland Kitchen COPD (chronic obstructive pulmonary disease)   . Lower extremity edema   . Fatigue   . Joint ache   . Psoriasis     Past Surgical History  Procedure Laterality Date  . Lumbar disc surgery    . Ankle surgery    . Tonsillectomy      History   Social History  . Marital Status: Single    Spouse Name: N/A    Number of Children: N/A  . Years of Education: N/A   Occupational History  .      Quality engineer   Social History Main Topics  . Smoking status: Former Games developer  . Smokeless tobacco: Not on file  . Alcohol Use: Yes     Comment: Several glasse of wine per night  . Drug Use: Not on file  . Sexually Active: Not on file    Other Topics Concern  . Not on file   Social History Narrative  . No narrative on file    ROS: no fevers or chills, productive cough, hemoptysis, dysphasia, odynophagia, melena, hematochezia, dysuria, hematuria, rash, seizure activity, orthopnea, PND, pedal edema, claudication. Remaining systems are negative.  Physical Exam: Well-developed obese in no acute distress.  Skin is warm and dry.  HEENT is normal.  Neck is supple.  Chest is clear to auscultation with normal expansion.  Cardiovascular exam is regular rate and rhythm.  Abdominal exam nontender or distended. No masses palpated. Extremities show no edema. neuro grossly intact

## 2012-09-09 NOTE — Patient Instructions (Addendum)
Your physician recommends that you schedule a follow-up appointment in: 3 MONTHS WITH DR CRENSHAW  STOP BISOPROLOL  START DILTIAZEM 120 MG ONCE DAILY

## 2012-09-09 NOTE — Assessment & Plan Note (Signed)
Management per primary care. 

## 2012-09-20 ENCOUNTER — Telehealth: Payer: Self-pay | Admitting: Cardiology

## 2012-09-20 NOTE — Telephone Encounter (Signed)
Reviewed information with pharmacist Lillia Pauls.  It is not felt that the beta blocker is the cause of pts cough.  Spoke with pt who states cough started 1 month after starting beta blocker and she has had it since June.  She has not seen an improvement in the cough since stopping the beta blocker and starting Cardizem.  The only improvement she has had was when she took prednisone and that improvement was limited.  Antibiotics that her PCP gave her X 2 have not helped either.

## 2012-09-20 NOTE — Telephone Encounter (Signed)
She had a normal CXR 09/29/2011  Pt aware Dr Jens Som is not in the office however I will forward this information to both he and his nurse for review and further recommendations.

## 2012-09-20 NOTE — Telephone Encounter (Signed)
New Problem:    Patient called in in wanting to know how long it would be before seeing an improvement in her cough after switching to diltiazem (CARDIZEM CD) 120 MG 24 hr capsule.  Please call back.

## 2012-09-20 NOTE — Telephone Encounter (Signed)
Cough not related to cardizem Fu with her primary care for further eval of cough Cynthia Obrien

## 2012-09-21 NOTE — Telephone Encounter (Signed)
Spoke with pt, Aware of dr crenshaw's recommendations.  °

## 2012-10-21 ENCOUNTER — Encounter: Payer: Self-pay | Admitting: Internal Medicine

## 2012-10-21 ENCOUNTER — Ambulatory Visit (INDEPENDENT_AMBULATORY_CARE_PROVIDER_SITE_OTHER): Payer: 59 | Admitting: Internal Medicine

## 2012-10-21 VITALS — BP 128/82 | HR 99 | Temp 97.5°F | Ht 65.0 in | Wt 309.4 lb

## 2012-10-21 DIAGNOSIS — R059 Cough, unspecified: Secondary | ICD-10-CM

## 2012-10-21 DIAGNOSIS — R05 Cough: Secondary | ICD-10-CM

## 2012-10-21 MED ORDER — PANTOPRAZOLE SODIUM 40 MG PO TBEC: 40.0000 mg | DELAYED_RELEASE_TABLET | Freq: Every day | ORAL | Status: DC

## 2012-10-21 MED ORDER — FAMOTIDINE 20 MG PO TABS: ORAL_TABLET | ORAL | Status: DC

## 2012-10-21 MED ORDER — PREDNISONE (PAK) 10 MG PO TABS: ORAL_TABLET | ORAL | Status: DC

## 2012-10-21 MED ORDER — TRAMADOL HCL 50 MG PO TABS: ORAL_TABLET | ORAL | Status: DC

## 2012-10-21 NOTE — Progress Notes (Signed)
Subjective:     Patient ID: Cynthia Obrien, female   DOB: 1966-06-30  MRN: [id number]  HPI  1 yowf quit smoking 1999 with lifelong problems with seasaol allergy in spring consisting of sneezing running nose with pollen and always resolves when when pollen goes away, never assoc with asthma or cough, self referred  10/21/2012 to [facility] pulmonary clinic for chronic cough  10/21/2012 1st pulmonary cc abrupt onset May 2013 w/in a few days of starting toprol > change to bystolic > change CCB no change. Assoc with 70 lb wt gain and mild doe proportionate to wt gain or coughing but no obvious daytime variabilty or assoc  cp or chest tightness, subjective wheeze overt sinus or hb symptoms. No unusual exp hx or h/o childhood pna/ asthma or premature birth to her knowledge.   Cough is daily, later in the day never while sleeping  p taking benadryl at hs, worse when talking or at work. Some better on prednisone but never completely resolved.   Also denies any obvious fluctuation of symptoms with weather or environmental changes or other aggravating or alleviating factors except as outlined above.    Kouffman Reflux v Neurogenic Cough Differentiator Reflux Comments  Do you awaken from a sound sleep coughing violently?                            With trouble breathing? no   Do you have choking episodes when you cannot  Get enough air, gasping for air ?              Yes   Do you usually cough when you lie down into  The bed, or when you just lie down to rest ?                          no   Do you usually cough after meals or eating?         yes   Do you cough when (or after) you bend over?    Yes   GERD SCORE     Kouffman Reflux v Neurogenic Cough Differentiator Neurogenic   Do you more-or-less cough all day long? yes   Does change of temperature make you cough? yes   Does laughing or chuckling cause you to cough? yes   Do fumes (perfume, automobile fumes, burned  Toast, etc.,) cause you to cough ?       yes   Does speaking, singing, or talking on the phone cause you to cough   ?               yes   Neurogenic/Airway score          Review of Systems  Constitutional: Negative for fever, chills and unexpected weight change.  HENT: Negative for ear pain, nosebleeds, congestion, sore throat, rhinorrhea, sneezing, trouble swallowing, dental problem, voice change, postnasal drip and sinus pressure.   Eyes: Negative for visual disturbance.  Respiratory: Positive for cough and shortness of breath. Negative for choking.   Cardiovascular: Positive for leg swelling. Negative for chest pain.  Gastrointestinal: Negative for vomiting, abdominal pain and diarrhea.  Genitourinary: Negative for difficulty urinating.  Musculoskeletal: Negative for arthralgias.  Skin: Positive for rash.  Neurological: Negative for tremors, syncope and headaches.  Hematological: Does not bruise/bleed easily.       Objective:   Physical Exam    hoarse amb wf nad  Wt Readings from Last 3 Encounters:  10/21/12 309 lb 6.4 oz (140.343 kg)  09/09/12 303 lb (137.44 kg)  07/28/12 300 lb (136.079 kg)   HEENT: nl dentition, turbinates, and orophanx. Nl external ear canals without cough reflex   NECK :  without JVD/Nodes/TM/ nl carotid upstrokes bilaterally   LUNGS: no acc muscle use, clear to A and P bilaterally without cough on insp or exp maneuvers   CV:  RRR  no s3 or murmur or increase in P2, no edema   ABD:  soft and nontender with nl excursion in the supine position. No bruits or organomegaly, bowel sounds nl  MS:  warm without deformities, calf tenderness, cyanosis or clubbing  SKIN: warm and dry without lesions    NEURO:  alert, approp, no deficits   Assessment:           Plan:

## 2012-10-21 NOTE — Patient Instructions (Addendum)
The key to effective treatment for your cough is eliminating the non-stop cycle of cough you're stuck in long enough to let your airway heal completely and then see if there is anything still making you cough once you stop the cough suppression, but this should take no more than 5 days to figuure out  First take delsym two tsp every 12 hours and supplement if needed with  tramadol 50 mg up to 2 every 4 hours to suppress the urge to cough. Swallowing water or using ice chips/non mint and menthol containing candies (such as lifesavers or sugarless jolly ranchers) are also effective.  You should rest your voice and avoid activities that you know make you cough.  Once you have eliminated the cough for 3 straight days try reducing the tramadol first,  then the delsym as tolerated.    Try prilosec 20mg   Take 30-60 min before first meal of the day and Pepcid 20 mg one bedtime until cough is completely gone for at least a week without the need for cough suppression  I think of reflux for chronic cough like I do oxygen for fire (doesn't cause the fire but once you get the oxygen suppressed it usually goes away regardless of the exact cause).  GERD (REFLUX)  is an extremely common cause of respiratory symptoms, many times with no significant heartburn at all.    It can be treated with medication, but also with lifestyle changes including avoidance of late meals, excessive alcohol, smoking cessation, and avoid fatty foods, chocolate, peppermint, colas, red wine, and acidic juices such as orange juice.  NO MINT OR MENTHOL PRODUCTS SO NO COUGH DROPS  USE SUGARLESS CANDY INSTEAD (jolley ranchers or Stover's)  NO OIL BASED VITAMINS - use powdered substitutes.    Prednisone 10 mg take  4 each am x 2 days,   2 each am x 2 days,  1 each am x 2 days and stop   Please schedule a follow up office visit in 4 weeks, sooner if needed  Late add: no cxr on file > needs to be done one return

## 2012-10-22 DIAGNOSIS — R05 Cough: Secondary | ICD-10-CM | POA: Insufficient documentation

## 2012-10-22 DIAGNOSIS — R059 Cough, unspecified: Secondary | ICD-10-CM | POA: Insufficient documentation

## 2012-10-22 NOTE — Assessment & Plan Note (Signed)
The most common causes of chronic cough in immunocompetent adults include the following: upper airway cough syndrome (UACS), previously referred to as postnasal drip syndrome (PNDS), which is caused by variety of rhinosinus conditions; (2) asthma; (3) GERD; (4) chronic bronchitis from cigarette smoking or other inhaled environmental irritants; (5) nonasthmatic eosinophilic bronchitis; and (6) bronchiectasis.   These conditions, singly or in combination, have accounted for up to 94% of the causes of chronic cough in prospective studies.   Other conditions have constituted no >6% of the causes in prospective studies These have included bronchogenic carcinoma, chronic interstitial pneumonia, sarcoidosis, left ventricular failure, ACEI-induced cough, and aspiration from a condition associated with pharyngeal dysfunction.   .Chronic cough is often simultaneously caused by more than one condition. A single cause has been found from 38 to 82% of the time, multiple causes from 18 to 62%. Multiply caused cough has been the result of three diseases up to 42% of the time.    Of the three most common causes of chronic cough, only one (GERD)  can actually cause the other two (asthma and post nasal drip syndrome)  and perpetuate the cylce of cough inducing airway trauma, inflammation, heightened sensitivity to reflux which is prompted by the cough itself via a cyclical mechanism.    This may partially respond to steroids and look like asthma and post nasal drainage but never erradicated completely unless the cough and the secondary reflux are eliminated, preferably both at the same time.  While not intuitively obvious, many patients with chronic low grade reflux do not cough until there is a secondary insult that disturbs the protective epithelial barrier and exposes sensitive nerve endings.  This can be viral or direct physical injury such as with an endotracheal tube.   The point is that once this occurs, it is  difficult to eliminate using anything but a maximally effective acid suppression regimen at least in the short run, accompanied by an appropriate diet to address non acid GERD.    See instructions for specific recommendations which were reviewed directly with the patient who was given a copy with highlighter outlining the key components.  

## 2012-11-14 ENCOUNTER — Encounter: Payer: Self-pay | Admitting: Internal Medicine

## 2012-11-14 ENCOUNTER — Ambulatory Visit (INDEPENDENT_AMBULATORY_CARE_PROVIDER_SITE_OTHER): Payer: 59 | Admitting: Internal Medicine

## 2012-11-14 ENCOUNTER — Ambulatory Visit (INDEPENDENT_AMBULATORY_CARE_PROVIDER_SITE_OTHER)
Admission: RE | Admit: 2012-11-14 | Discharge: 2012-11-14 | Disposition: A | Discharge status: Home / Self Care | Payer: 59 | Source: Ambulatory Visit | Attending: Internal Medicine | Admitting: Internal Medicine

## 2012-11-14 VITALS — BP 140/80 | HR 78 | Temp 98.0°F | Ht 65.0 in | Wt 305.8 lb

## 2012-11-14 DIAGNOSIS — R059 Cough, unspecified: Secondary | ICD-10-CM

## 2012-11-14 DIAGNOSIS — R05 Cough: Secondary | ICD-10-CM

## 2012-11-14 MED ORDER — MONTELUKAST SODIUM 10 MG PO TABS: ORAL_TABLET | ORAL | Status: DC

## 2012-11-14 MED ORDER — PREDNISONE (PAK) 10 MG PO TABS: ORAL_TABLET | ORAL | Status: DC

## 2012-11-14 NOTE — Patient Instructions (Addendum)
Add back pepcid 20 mg at bedtime  Singulair 10 mg one each pm  Prednisone 10 mg take  4 each am x 2 days,   2 each am x 2 days,  1 each am x2days and stop   We will arrange a chest x-ray for you at [facility]  Please schedule a follow up office visit in 4 weeks, sooner if needed

## 2012-11-14 NOTE — Progress Notes (Signed)
Subjective:     Patient ID: Cynthia Obrien, female   DOB: Sep 12, 1965  MRN: [id number]  HPI  39 yowf quit smoking 1999 with lifelong problems with seasaol allergy in spring consisting of sneezing running nose with pollen and always resolves when when pollen goes away, never assoc with asthma or cough, self referred  10/21/2012 to [facility] pulmonary clinic for chronic cough  10/21/2012 1st pulmonary ov/ Leisl Spurrier cc abrupt onset May 2013 w/in a few days of starting toprol > change to bystolic > change CCB no change. Assoc with 70 lb wt gain and mild doe proportionate to wt gain or coughing. rec First take delsym two tsp every 12 hours and supplement if needed with  tramadol 50 mg up to 2 every 4 hours to suppress the urge to cough. Try prilosec 20mg   Take 30-60 min before first meal of the day and Pepcid 20 mg one bedtime until cough is completely gone  . GERD diet Prednisone 10 mg take  4 each am x 2 days,   2 each am x 2 days,  1 each am x 2 days and stop  Chlortrimeton at hs prn    11/14/2012 f/u ov/Jaxden Blyden chronic cough Chief Complaint  Patient presents with  . Follow-up    Cough had imrproved alot while on prednisone, but when finished cough started coming back. She states cough still not as severe today as her first visit.    cough 100% gone off cough meds and on ppi 20 mg qd but w/in 3 days of stop prednisone same cough as usual recurred as in past rx with prednisone.  Has not tried singulair having some noct wheeze but daytime, no notc, cough dry in character.  Her hoarseness better, no better p saba in past   No obvious daytime variabilty or assoc  cp or chest tightness, subjective wheeze overt sinus or hb symptoms. No unusual exp hx or h/o childhood pna/ asthma or premature birth to her knowledge.   Cough is daily, later in the day never while sleeping  p taking benadryl at hs, worse when talking or at work. Some better on prednisone but never completely resolved.   Also denies any obvious  fluctuation of symptoms with weather or environmental changes or other aggravating or alleviating factors except as outlined above.    Kouffman Reflux v Neurogenic Cough Differentiator Reflux Comments  Do you awaken from a sound sleep coughing violently?                            With trouble breathing? no   Do you have choking episodes when you cannot  Get enough air, gasping for air ?              Yes   Do you usually cough when you lie down into  The bed, or when you just lie down to rest ?                          no   Do you usually cough after meals or eating?         yes   Do you cough when (or after) you bend over?    Yes   GERD SCORE     Kouffman Reflux v Neurogenic Cough Differentiator Neurogenic   Do you more-or-less cough all day long? yes   Does change of temperature make you cough? yes  Does laughing or chuckling cause you to cough? yes   Do fumes (perfume, automobile fumes, burned  Toast, etc.,) cause you to cough ?      yes   Does speaking, singing, or talking on the phone cause you to cough   ?               yes   Neurogenic/Airway score          Current Medications, Allergies, Past Medical History, Past Surgical History, Family History, and Social History were reviewed in [employer] record.  ROS  The following are not active complaints unless bolded sore throat, dysphagia, dental problems, itching, sneezing,  nasal congestion or excess/ purulent secretions, ear ache,   fever, chills, sweats, unintended wt loss, pleuritic or exertional cp, hemoptysis,  orthopnea pnd or leg swelling, presyncope, palpitations, heartburn, abdominal pain, anorexia, nausea, vomiting, diarrhea  or change in bowel or urinary habits, change in stools or urine, dysuria,hematuria,  rash, arthralgias, visual complaints, headache, numbness weakness or ataxia or problems with walking or coordination,  change in mood/affect or memory.         Objective:   Physical  Exam    hoarse amb wf nad  11/14/2012      305 Wt Readings from Last 3 Encounters:  10/21/12 309 lb 6.4 oz (140.343 kg)  09/09/12 303 lb (137.44 kg)  07/28/12 300 lb (136.079 kg)   HEENT: nl dentition, turbinates, and orophanx. Nl external ear canals without cough reflex   NECK :  without JVD/Nodes/TM/ nl carotid upstrokes bilaterally   LUNGS: no acc muscle use, clear to A and P bilaterally without cough on insp or exp maneuvers   CV:  RRR  no s3 or murmur or increase in P2, no edema   ABD:  soft and nontender with nl excursion in the supine position. No bruits or organomegaly, bowel sounds nl  MS:  warm without deformities, calf tenderness, cyanosis or clubbing     CXR  11/14/2012 :  Borderline cardiomegaly, with some exaggeration of the heart size likely secondary to the low lung volumes. Otherwise negative.     Assessment:           Plan:

## 2012-11-14 NOTE — Assessment & Plan Note (Addendum)
-   onset May 2013 - Spirometry 11/14/2012  wnl  Cough responsive to prednisone suggests eos rhinitis with pnds, eos bronchitis or cough variant asthma. Since she has apparently tried and failed saba rec trial of singulair, the one asthma medication besides prednisone that can't potentially cause a cough  Also former smoker so needs cxr to complete the w/u but no evidence at all for copd here.

## 2012-11-15 NOTE — Progress Notes (Signed)
Quick Note:  Spoke with pt and notified of results per Dr. Wert. Pt verbalized understanding and denied any questions.  ______ 

## 2012-11-18 ENCOUNTER — Telehealth: Payer: Self-pay | Admitting: Internal Medicine

## 2012-11-18 ENCOUNTER — Ambulatory Visit: Payer: 59 | Admitting: Internal Medicine

## 2012-11-18 ENCOUNTER — Encounter: Payer: Self-pay | Admitting: *Deleted

## 2012-11-18 ENCOUNTER — Ambulatory Visit (INDEPENDENT_AMBULATORY_CARE_PROVIDER_SITE_OTHER): Payer: 59 | Admitting: Internal Medicine

## 2012-11-18 ENCOUNTER — Encounter: Payer: Self-pay | Admitting: Internal Medicine

## 2012-11-18 VITALS — BP 114/80 | HR 70 | Temp 97.9°F | Ht 65.0 in | Wt 309.0 lb

## 2012-11-18 DIAGNOSIS — R05 Cough: Secondary | ICD-10-CM

## 2012-11-18 MED ORDER — PREDNISONE (PAK) 10 MG PO TABS: ORAL_TABLET | ORAL | Status: DC

## 2012-11-18 MED ORDER — TRAMADOL HCL 50 MG PO TABS: ORAL_TABLET | ORAL | Status: DC

## 2012-11-18 NOTE — Telephone Encounter (Signed)
Spoke with patient-- She states after 4 days on prednisone taper she still does not feel well, in fact feels worse Still coughing and losing her voice by the end of the day Patient req recs fro Dr. Sherene Sires-- is allergy testing needed?  Dr. Sherene Sires please advise thank you!  OV on 11/14/12: Patient Instructions    Add back pepcid 20 mg at bedtime  Singulair 10 mg one each pm  Prednisone 10 mg take 4 each am x 2 days, 2 each am x 2 days, 1 each am x2days and stop  We will arrange a chest x-ray for you at [facility]  Please schedule a follow up office visit in 4 weeks, sooner if needed     Allergies  Allergen Reactions  . Amoxicillin Hives  . Erythromycin Hives  . Other     "all cillins cause hives"

## 2012-11-18 NOTE — Assessment & Plan Note (Signed)
-   onset May 2013  ? Better when out of [place] or away from work - Spirometry 11/14/2012  wnl - rx singulair 11/14/2012 >>> - allergy profile 11/18/2012 >>>  I had an extended discussion with the patient today lasting 15 to 20 minutes of a 25 minute visit on the following issues:  The standardized cough guidelines published in Chest by [place] in 2006 are still the best available and consist of a multiple step process (up to 12!) , not a single office visit,  and are intended  to address this problem logically,  with an alogrithm dependent on response to empiric treatment at  each progressive step  to determine a specific diagnosis with  minimal addtional testing needed. Therefore if adherence is an issue or can't be accurately verified,  it's very unlikely the standard evaluation and treatment will be successful here.    Furthermore, response to therapy (other than acute cough suppression, which should only be used short term with avoidance of narcotic containing cough syrups if possible), can be a gradual process for which the patient may perceive immediate benefit.  Unlike going to an eye doctor where the best perscription is almost always the first one and is immediately effective, this is almost never the case in the management of chronic cough syndromes. Therefore the patient needs to commit up front to consistently adhere to recommendations  for up to 6 weeks of therapy directed at the likely underlying problem(s) before the response can be reasonably evaluated.   Next step is heavy cough suppression while using prednisone with goal of 100% cough eliminationx minimum of 3 days then see if it comes back while maintaining on singulair and then consider methacholine challenge next though asthma less likely based on neg response to saba and absence of noct cough.

## 2012-11-18 NOTE — Telephone Encounter (Signed)
Ok to add [facility] to [facility] this afternoon but must bring all active meds including otcs to visit

## 2012-11-18 NOTE — Progress Notes (Signed)
Subjective:     Patient ID: Cynthia Obrien, female   DOB: 05-Nov-1965  MRN: [id number]  HPI  62 yowf quit smoking 1999 with lifelong problems with seasaol allergy in spring consisting of sneezing running nose with pollen and always resolves when when pollen goes away, never assoc with asthma or cough, self referred  10/21/2012 to [facility] pulmonary clinic for chronic cough  10/21/2012 1st pulmonary ov/ Cynthia Obrien cc abrupt onset May 2013 w/in a few days of starting toprol > change to bystolic > change CCB no change. Assoc with 70 lb wt gain and mild doe proportionate to wt gain or coughing. rec First take delsym two tsp every 12 hours and supplement if needed with  tramadol 50 mg up to 2 every 4 hours to suppress the urge to cough. Try prilosec 20mg   Take 30-60 min before first meal of the day and Pepcid 20 mg one bedtime until cough is completely gone  . GERD diet Prednisone 10 mg take  4 each am x 2 days,   2 each am x 2 days,  1 each am x 2 days and stop  Chlortrimeton at hs prn    11/14/2012 f/u ov/Cynthia Obrien chronic cough Chief Complaint  Patient presents with  . Follow-up    Cough had imrproved alot while on prednisone, but when finished cough started coming back. She states cough still not as severe today as her first visit.   cough 100% gone off cough meds and on ppi 20 mg qd but w/in 3 days of stop prednisone same cough as usual recurred as in past rx with prednisone.  Has not tried singulair having some noct wheeze but daytime, no notc, cough dry in character.  Her hoarseness better, no better p saba in past Add back pepcid 20 mg at bedtime Singulair 10 mg one each pm Prednisone 10 mg take  4 each am x 2 days,   2 each am x 2 days,  1 each am x2days and stop     11/18/2012 f/u ov/Cynthia Obrien re cough Chief Complaint  Patient presents with  . Acute Visit    Cough no better at all since the last visit, still non prod and gets worse as the day progresses.   get up around 7, then starts coughing 10 p  getting to work at 730, seems worse talking/ working/ goes away p gets home from work, lives alone and no talking at home vs lots at work.  Dry cough, some sneezing last few days, sometimes chokes on drinks.    Cough better in [place] and [place] for up to a week, resumed when went back to work. No better with saba    No obvious daytime variabilty or assoc  cp or chest tightness, subjective wheeze overt sinus or hb symptoms. No unusual exp hx or h/o childhood pna/ asthma or premature birth to her knowledge.   Cough is daily, later in the day never while sleeping  p taking benadryl at hs, worse when talking or at work. Some better on prednisone but never completely resolved.   Also denies any obvious fluctuation of symptoms with weather or environmental changes or other aggravating or alleviating factors except as outlined above.    Kouffman Reflux v Neurogenic Cough Differentiator Reflux Comments  Do you awaken from a sound sleep coughing violently?                            With  trouble breathing? no   Do you have choking episodes when you cannot  Get enough air, gasping for air ?              Yes   Do you usually cough when you lie down into  The bed, or when you just lie down to rest ?                          no   Do you usually cough after meals or eating?         No    Do you cough when (or after) you bend over?    Yes   GERD SCORE     Kouffman Reflux v Neurogenic Cough Differentiator Neurogenic   Do you more-or-less cough all day long? yes   Does change of temperature make you cough? yes   Does laughing or chuckling cause you to cough? yes   Do fumes (perfume, automobile fumes, burned  Toast, etc.,) cause you to cough ?      yes   Does speaking, singing, or talking on the phone cause you to cough   ?               yes   Neurogenic/Airway score          Current Medications, Allergies, Past Medical History, Past Surgical History, Family History, and Social History were  reviewed in [employer] record.  ROS  The following are not active complaints unless bolded sore throat, dysphagia, dental problems, itching, sneezing,  nasal congestion or excess/ purulent secretions, ear ache,   fever, chills, sweats, unintended wt loss, pleuritic or exertional cp, hemoptysis,  orthopnea pnd or leg swelling, presyncope, palpitations, heartburn, abdominal pain, anorexia, nausea, vomiting, diarrhea  or change in bowel or urinary habits, change in stools or urine, dysuria,hematuria,  rash, arthralgias, visual complaints, headache, numbness weakness or ataxia or problems with walking or coordination,  change in mood/affect or memory.         Objective:   Physical Exam    hoarse amb obese  wf nad  11/14/2012      305 Wt Readings from Last 3 Encounters:  10/21/12 309 lb 6.4 oz (140.343 kg)  09/09/12 303 lb (137.44 kg)  07/28/12 300 lb (136.079 kg)   HEENT: nl dentition, turbinates, and orophanx. Nl external ear canals without cough reflex   NECK :  without JVD/Nodes/TM/ nl carotid upstrokes bilaterally   LUNGS: no acc muscle use, clear to A and P bilaterally without cough on insp or exp maneuvers   CV:  RRR  no s3 or murmur or increase in P2, no edema   ABD:  soft and nontender with nl excursion in the supine position. No bruits or organomegaly, bowel sounds nl  MS:  warm without deformities, calf tenderness, cyanosis or clubbing     CXR  11/14/2012 :  Borderline cardiomegaly, with some exaggeration of the heart size likely secondary to the low lung volumes. Otherwise negative.     Assessment:           Plan:

## 2012-11-18 NOTE — Telephone Encounter (Signed)
Pt has been scheduled for 3:15pm on [facility] schedule.

## 2012-11-18 NOTE — Patient Instructions (Addendum)
Prednisone 10 mg take  4 each am x 2 days,   2 each am x 2 days,  1 each am x2days and stop   Take delsym two tsp every 12 hours and supplement if needed with  tramadol 50 mg up to 2 every 4 hours to suppress the urge to cough. Swallowing water or using ice chips/non mint and menthol containing candies (such as lifesavers or sugarless jolly ranchers) are also effective.  You should rest your voice and avoid activities that you know make you cough.  Once you have eliminated the cough for 3 straight days try reducing the tramadol first,  then the delsym as tolerated.    GERD (REFLUX)  is an extremely common cause of respiratory symptoms, many times with no significant heartburn at all.    It can be treated with medication, but also with lifestyle changes including avoidance of late meals, excessive alcohol, smoking cessation, and avoid fatty foods, chocolate, peppermint, colas, red wine, and acidic juices such as orange juice.  NO MINT OR MENTHOL PRODUCTS SO NO COUGH DROPS  USE SUGARLESS CANDY INSTEAD (jolley ranchers or Stover's)  NO OIL BASED VITAMINS - use powdered substitutes.    Keep previous appointment and stay on singulair and reflux medications.

## 2012-11-19 LAB — CBC WITH DIFFERENTIAL/PLATELET
Eosinophils Relative: 0 % (ref 0–5)
HCT: 42.2 % (ref 36.0–46.0)
Hemoglobin: 14.2 g/dL (ref 12.0–15.0)
Lymphocytes Relative: 12 % (ref 12–46)
Lymphs Abs: 1 10*3/uL (ref 0.7–4.0)
MCV: 96.3 fL (ref 78.0–100.0)
Monocytes Absolute: 0.7 10*3/uL (ref 0.1–1.0)
Monocytes Relative: 9 % (ref 3–12)
Neutro Abs: 6.7 10*3/uL (ref 1.7–7.7)
RBC: 4.38 MIL/uL (ref 3.87–5.11)
RDW: 14.5 % (ref 11.5–15.5)
WBC: 8.5 10*3/uL (ref 4.0–10.5)

## 2012-11-20 ENCOUNTER — Encounter: Payer: Self-pay | Admitting: Internal Medicine

## 2012-11-20 LAB — ALLERGY PROFILE REGION II-DC, DE, MD, ~~LOC~~, VA
Allergen, D pternoyssinus,d7: <0.1 kU/L
Alternaria Alternata: <0.1 kU/L
Aspergillus fumigatus, m3: <0.1 kU/L
Box Elder IgE: 0.12 kU/L — ABNORMAL HIGH
Cockroach: <0.1 kU/L
Dog Dander: <0.1 kU/L
Pecan/Hickory Tree IgE: 0.1 kU/L — ABNORMAL HIGH

## 2012-11-21 NOTE — Progress Notes (Signed)
Quick Note:  Spoke with pt and notified of results per Dr. Wert. Pt verbalized understanding and denied any questions.  ______ 

## 2012-11-30 ENCOUNTER — Encounter: Payer: Self-pay | Admitting: Internal Medicine

## 2012-11-30 ENCOUNTER — Ambulatory Visit (INDEPENDENT_AMBULATORY_CARE_PROVIDER_SITE_OTHER): Payer: 59 | Admitting: Internal Medicine

## 2012-11-30 VITALS — BP 112/80 | HR 104 | Temp 97.1°F | Ht 64.5 in | Wt 306.0 lb

## 2012-11-30 DIAGNOSIS — R059 Cough, unspecified: Secondary | ICD-10-CM

## 2012-11-30 DIAGNOSIS — R05 Cough: Secondary | ICD-10-CM

## 2012-11-30 MED ORDER — GABAPENTIN 100 MG PO CAPS: 100.0000 mg | ORAL_CAPSULE | Freq: Three times a day (TID) | ORAL | Status: DC

## 2012-11-30 MED ORDER — PREDNISONE (PAK) 10 MG PO TABS: ORAL_TABLET | ORAL | Status: DC

## 2012-11-30 NOTE — Patient Instructions (Addendum)
Neurontin 100 mg three times daily   Stay on protonix and pepcid 20 mg at bedtime  Prednisone 10 mg take  4 each am x 2 days,   2 each am x 2 days,  1 each am x2days and stop   Stop singulair  Change your follow up to 4 weeks  Late add consider MCT next to be complete

## 2012-11-30 NOTE — Progress Notes (Signed)
Subjective:     Patient ID: Cynthia Obrien, female   DOB: Jun 28, 1966  MRN: [id number]    Brief patient profile:  47 yowf quit smoking 1999 with lifelong problems with seasaol allergy in spring consisting of sneezing running nose with pollen and always resolves when when pollen goes away, never assoc with asthma or cough, self referred  10/21/2012 to [facility] pulmonary clinic for chronic cough  10/21/2012 1st pulmonary ov/ Tmya Wigington cc abrupt onset May 2013 w/in a few days of starting toprol > change to bystolic > change CCB no change. Assoc with 70 lb wt gain and mild doe proportionate to wt gain or coughing. rec Cyclical coug rx plus  Chlortrimeton at hs prn    11/14/2012 f/u ov/Day Greb chronic cough Chief Complaint  Patient presents with  . Follow-up    Cough had imrproved alot while on prednisone, but when finished cough started coming back. She states cough still not as severe today as her first visit.   cough 100% gone off cough meds and on ppi 20 mg qd but w/in 3 days of stop prednisone same cough as usual recurred as in past rx with prednisone.  Has not tried singulair having some noct wheeze but daytime, no notc, cough dry in character.  Her hoarseness better, no better p saba in past rec Add back pepcid 20 mg at bedtime Singulair 10 mg one each pm Prednisone 10 mg take  4 each am x 2 days,   2 each am x 2 days,  1 each am x2days and stop     11/18/2012 f/u ov/Ethen Bannan re cough Chief Complaint  Patient presents with  . Acute Visit    Cough no better at all since the last visit, still non prod and gets worse as the day progresses.  get up around 7am, then starts coughing 10a p getting to work at 730, seems worse talking/ working/ goes away p gets home from work, lives alone and no talking at home vs lots at work.  Dry cough, some sneezing   sometimes chokes on drinks.   Cough better in [place] and [place] for up to a week, resumed when went back to work. No better with saba. rec Cyclical  cough rx Keep previous appointment and stay on singulair and reflux medications.        11/30/2012 f/u ov/Uthman Mroczkowski still on singulair Chief Complaint  Patient presents with  . Follow-up    Pt states that her cough had improved for a few days while she was not working, but since started back working her cough is back where it was last visit.   cough never completely resolved even on max cyclical rx but seems best on high dose pred and when not working.  No obvious daytime variabilty or assoc excess mucus or cp or chest tightness, subjective wheeze overt sinus or hb symptoms. No unusual exp hx or h/o childhood pna/ asthma or premature birth to her knowledge.  Cough better at hs.   Kouffman Reflux v Neurogenic Cough Differentiator Reflux Comments  Do you awaken from a sound sleep coughing violently?                            With trouble breathing? no   Do you have choking episodes when you cannot  Get enough air, gasping for air ?              Yes   Do you usually  cough when you lie down into  The bed, or when you just lie down to rest ?                          no   Do you usually cough after meals or eating?         No    Do you cough when (or after) you bend over?    Yes   GERD SCORE     Kouffman Reflux v Neurogenic Cough Differentiator Neurogenic   Do you more-or-less cough all day long? yes   Does change of temperature make you cough? yes   Does laughing or chuckling cause you to cough? yes   Do fumes (perfume, automobile fumes, burned  Toast, etc.,) cause you to cough ?      yes   Does speaking, singing, or talking on the phone cause you to cough   ?               yes   Neurogenic/Airway score          Current Medications, Allergies, Past Medical History, Past Surgical History, Family History, and Social History were reviewed in [employer] record.  ROS  The following are not active complaints unless bolded sore throat, dysphagia, dental problems,  itching, sneezing,  nasal congestion or excess/ purulent secretions, ear ache,   fever, chills, sweats, unintended wt loss, pleuritic or exertional cp, hemoptysis,  orthopnea pnd or leg swelling, presyncope, palpitations, heartburn, abdominal pain, anorexia, nausea, vomiting, diarrhea  or change in bowel or urinary habits, change in stools or urine, dysuria,hematuria,  rash, arthralgias, visual complaints, headache, numbness weakness or ataxia or problems with walking or coordination,  change in mood/affect or memory.         Objective:   Physical Exam    hoarse amb obese  wf nad  11/14/2012      305 > 11/30/2012  306 Wt Readings from Last 3 Encounters:  10/21/12 309 lb 6.4 oz (140.343 kg)  09/09/12 303 lb (137.44 kg)  07/28/12 300 lb (136.079 kg)   HEENT: nl dentition, turbinates, and orophanx. Nl external ear canals without cough reflex   NECK :  without JVD/Nodes/TM/ nl carotid upstrokes bilaterally   LUNGS: no acc muscle use, clear to A and P bilaterally without cough on insp or exp maneuvers   CV:  RRR  no s3 or murmur or increase in P2, no edema   ABD:  soft and nontender with nl excursion in the supine position. No bruits or organomegaly, bowel sounds nl  MS:  warm without deformities, calf tenderness, cyanosis or clubbing     CXR  11/14/2012 :  Borderline cardiomegaly, with some exaggeration of the heart size likely secondary to the low lung volumes. Otherwise negative.     Assessment:           Plan:

## 2012-12-01 NOTE — Assessment & Plan Note (Signed)
-   onset May 2013  ? 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   These are the same pts (now being labeled as having "irritable larynx syndrome" by some cough centers) who not infrequently have a history of having failed to tolerate ace inhibitors,  dry powder inhalers or biphosphonates or report having atypical reflux symptoms that don't respond to standard doses of PPI , and are easily confused as having aecopd or asthma flares by even experienced allergists/ pulmonologists.   Cough questionaire strongly favors neurogenic cough (irritable larynx) > rec trial of neurontin  Next step may be MCT to r/o occult asthma, though I strongly doubt it

## 2012-12-06 ENCOUNTER — Encounter: Payer: Self-pay | Admitting: Cardiology

## 2012-12-06 ENCOUNTER — Ambulatory Visit (INDEPENDENT_AMBULATORY_CARE_PROVIDER_SITE_OTHER): Payer: 59 | Admitting: Cardiology

## 2012-12-06 VITALS — BP 112/80 | HR 66 | Ht 64.0 in | Wt 307.0 lb

## 2012-12-06 DIAGNOSIS — E785 Hyperlipidemia, unspecified: Secondary | ICD-10-CM

## 2012-12-06 DIAGNOSIS — I498 Other specified cardiac arrhythmias: Secondary | ICD-10-CM

## 2012-12-06 DIAGNOSIS — I471 Supraventricular tachycardia: Secondary | ICD-10-CM

## 2012-12-06 NOTE — Patient Instructions (Addendum)
Your physician wants you to follow-up in: ONE YEAR WITH DR CRENSHAW You will receive a reminder letter in the mail two months in advance. If you don't receive a letter, please call our office to schedule the follow-up appointment.  

## 2012-12-06 NOTE — Assessment & Plan Note (Signed)
Management per primary care. 

## 2012-12-06 NOTE — Progress Notes (Signed)
HPI: Pleasant female for fu of SVT. Seen in the emergency room September 29, 2010 with palpitations. Her electrocardiogram revealed SVT which broke with adenosine. Patient states she had an episode similar to this previously in [place]. Echocardiogram in April of 2013 showed normal LV function and grade 2 diastolic dysfunction. TSH normal. Patient has not tolerated beta blockers in past due to cough. Therefore placed on cardizem. I last saw her in Feb 2014. Since then, the patient has dyspnea with more extreme activities but not with routine activities. It is relieved with rest. It is not associated with chest pain. There is no orthopnea, PND or pedal edema. There is no syncope or palpitations. There is no exertional chest pain. She continues to have a cough.    Current Outpatient Prescriptions  Medication Sig Dispense Refill  . aspirin 81 MG tablet Take 81 mg by mouth daily.      Marland Kitchen diltiazem (CARDIZEM CD) 120 MG 24 hr capsule Take 1 capsule (120 mg total) by mouth daily.  30 capsule  12  . famotidine (PEPCID) 20 MG tablet One at bedtime  30 tablet  2  . furosemide (LASIX) 40 MG tablet Take 40 mg by mouth daily.      Marland Kitchen gabapentin (NEURONTIN) 100 MG capsule Take 1 capsule (100 mg total) by mouth 3 (three) times daily.  90 capsule  2  . Levothyroxine Sodium (TIROSINT) 112 MCG CAPS Take 112 capsules by mouth daily.      . pantoprazole (PROTONIX) 40 MG tablet Take 1 tablet (40 mg total) by mouth daily. Take 30-60 min before first meal of the day  30 tablet  2  . traMADol (ULTRAM) 50 MG tablet 1-2 every 4 hours as needed for cough or pain  40 tablet  0   No current facility-administered medications for this visit.     Past Medical History  Diagnosis Date  . Hypothyroidism   . Hyperlipidemia   . SVT (supraventricular tachycardia)     a. 10/2011 Echo: EF 60-65%, Gr 2 DD.  Marland Kitchen COPD (chronic obstructive pulmonary disease)   . Lower extremity edema   . Fatigue   . Joint ache   . Psoriasis      Past Surgical History  Procedure Laterality Date  . Lumbar disc surgery    . Ankle surgery    . Tonsillectomy      History   Social History  . Marital Status: Single    Spouse Name: N/A    Number of Children: 0  . Years of Education: N/A   Occupational History  .      Quality engineer   Social History Main Topics  . Smoking status: Former Smoker -- 1.00 packs/day for 16 years    Types: Cigarettes    Quit date: 07/13/1997  . Smokeless tobacco: Not on file  . Alcohol Use: 4.2 oz/week    7 Glasses of wine per week     Comment: 1 glass of wine daily  . Drug Use: Not on file  . Sexually Active: Not on file   Other Topics Concern  . Not on file   Social History Narrative  . No narrative on file    ROS: no fevers or chills, productive cough, hemoptysis, dysphasia, odynophagia, melena, hematochezia, dysuria, hematuria, rash, seizure activity, orthopnea, PND, pedal edema, claudication. Remaining systems are negative.  Physical Exam: Well-developed obese in no acute distress.  Skin is warm and dry.  HEENT is normal.  Neck is supple.  Chest is clear to auscultation with normal expansion.  Cardiovascular exam is regular rate and rhythm.  Abdominal exam nontender or distended. No masses palpated. Extremities show no edema. neuro grossly intact  ECG sinus rhythm at a rate of 66. No ST changes.

## 2012-12-06 NOTE — Assessment & Plan Note (Signed)
No recent episodes. Continue Cardizem. Intolerant to beta blockers. If she has frequent episodes in the future we'll consider referral to electrophysiology for ablation.

## 2012-12-08 ENCOUNTER — Telehealth: Payer: Self-pay | Admitting: Internal Medicine

## 2012-12-08 NOTE — Telephone Encounter (Signed)
Spoke with pt She states about 3 days ago her cough returned after completely resolving again Cough is the same as usual, non prod  She denies any SOB or other co's I had offered to send msg to doc of the day in MW's absence She prefers to wait until MW returns to the office next wk since he is the most familiar with her She is asking about allergy consult I advised looks like the next step may be MCT according to last clinic note Will forward to MW for recs, and pt to call sooner if needed Please advise, thanks!

## 2012-12-08 NOTE — Telephone Encounter (Signed)
Next steps are to increase neurontin to 300 tid and then do Methacholine in one week if not better - allergy eval can be ordered now but will weeks to schedule and months to years to see results of any specific allergy treatments other than what she's already using.

## 2012-12-08 NOTE — Telephone Encounter (Signed)
Pt aware of recs. She will try increasing her neuronin. If this does not help she will call back to schedule [facility]. Nothing further was needed

## 2012-12-15 ENCOUNTER — Telehealth: Payer: Self-pay | Admitting: Internal Medicine

## 2012-12-15 NOTE — Telephone Encounter (Signed)
Ok

## 2012-12-15 NOTE — Telephone Encounter (Signed)
Per phone note  12/08/12 recs were to increase nuerotin to 300 mg TID. Please advise Dr. Sherene Sires if okay to send in RX to reflect this? thanks

## 2012-12-16 MED ORDER — GABAPENTIN 300 MG PO CAPS: 300.0000 mg | ORAL_CAPSULE | Freq: Three times a day (TID) | ORAL | Status: DC

## 2012-12-16 NOTE — Telephone Encounter (Signed)
Pt aware rx has been called in. Nothing further was needed

## 2012-12-19 ENCOUNTER — Ambulatory Visit (INDEPENDENT_AMBULATORY_CARE_PROVIDER_SITE_OTHER): Payer: 59 | Admitting: Internal Medicine

## 2012-12-19 ENCOUNTER — Encounter: Payer: Self-pay | Admitting: Internal Medicine

## 2012-12-19 ENCOUNTER — Other Ambulatory Visit (INDEPENDENT_AMBULATORY_CARE_PROVIDER_SITE_OTHER): Payer: 59

## 2012-12-19 VITALS — BP 140/90 | HR 86 | Temp 98.8°F | Ht 64.5 in | Wt 315.0 lb

## 2012-12-19 DIAGNOSIS — R05 Cough: Secondary | ICD-10-CM

## 2012-12-19 DIAGNOSIS — R0989 Other specified symptoms and signs involving the circulatory and respiratory systems: Secondary | ICD-10-CM

## 2012-12-19 DIAGNOSIS — R0600 Dyspnea, unspecified: Secondary | ICD-10-CM

## 2012-12-19 DIAGNOSIS — E039 Hypothyroidism, unspecified: Secondary | ICD-10-CM

## 2012-12-19 DIAGNOSIS — R059 Cough, unspecified: Secondary | ICD-10-CM

## 2012-12-19 LAB — TSH: TSH: 0.48 u[IU]/mL (ref 0.35–5.50)

## 2012-12-19 LAB — HEPATIC FUNCTION PANEL
ALT: 45 U/L — ABNORMAL HIGH (ref 0–35)
AST: 30 U/L (ref 0–37)
Albumin: 3.8 g/dL (ref 3.5–5.2)
Total Bilirubin: 1 mg/dL (ref 0.3–1.2)

## 2012-12-19 LAB — CBC WITH DIFFERENTIAL/PLATELET
Basophils Absolute: 0 10*3/uL (ref 0.0–0.1)
Basophils Relative: 0.4 % (ref 0.0–3.0)
Eosinophils Absolute: 0.2 10*3/uL (ref 0.0–0.7)
HCT: 41.6 % (ref 36.0–46.0)
Hemoglobin: 14.1 g/dL (ref 12.0–15.0)
Lymphs Abs: 1.6 10*3/uL (ref 0.7–4.0)
MCHC: 33.8 g/dL (ref 30.0–36.0)
Neutro Abs: 5.8 10*3/uL (ref 1.4–7.7)
RBC: 4.16 Mil/uL (ref 3.87–5.11)
RDW: 13.9 % (ref 11.5–14.6)

## 2012-12-19 LAB — BASIC METABOLIC PANEL
BUN: 11 mg/dL (ref 6–23)
CO2: 31 mEq/L (ref 19–32)
Calcium: 9.2 mg/dL (ref 8.4–10.5)
Chloride: 103 mEq/L (ref 96–112)
Creatinine, Ser: 1.1 mg/dL (ref 0.4–1.2)

## 2012-12-19 LAB — BRAIN NATRIURETIC PEPTIDE: Pro B Natriuretic peptide (BNP): 44 pg/mL (ref 0.0–100.0)

## 2012-12-19 MED ORDER — SPIRONOLACTONE 50 MG PO TABS: 50.0000 mg | ORAL_TABLET | Freq: Every day | ORAL | Status: DC

## 2012-12-19 NOTE — Progress Notes (Signed)
Subjective:     Patient ID: Cynthia Obrien, female   DOB: [birth year]  MRN: [id number]    Brief patient profile:  47 yowf quit smoking 1999 with lifelong problems with seasaol allergy in spring consisting of sneezing running nose with pollen and always resolves when when pollen goes away, never assoc with asthma or cough, self referred  10/21/2012 to [facility] pulmonary clinic for chronic cough  10/21/2012 1st pulmonary ov/ Cynthia Obrien cc abrupt onset May 2013 w/in a few days of starting toprol > change to bystolic > change CCB no change. Assoc with 70 lb wt gain and mild doe proportionate to wt gain or coughing. rec Cyclical coug rx plus  Chlortrimeton at hs prn    11/14/2012 f/u ov/Cynthia Obrien chronic cough Chief Complaint  Patient presents with  . Follow-up    Cough had imrproved alot while on prednisone, but when finished cough started coming back. She states cough still not as severe today as her first visit.   cough 100% gone off cough meds and on ppi 20 mg qd but w/in 3 days of stop prednisone same cough as usual recurred as in past rx with prednisone.  Has not tried singulair having some noct wheeze but daytime, no notc, cough dry in character.  Her hoarseness better, no better p saba in past rec Add back pepcid 20 mg at bedtime Singulair 10 mg one each pm Prednisone 10 mg take  4 each am x 2 days,   2 each am x 2 days,  1 each am x2days and stop     11/18/2012 f/u ov/Cynthia Obrien re cough Chief Complaint  Patient presents with  . Acute Visit    Cough no better at all since the last visit, still non prod and gets worse as the day progresses.  get up around 7am, then starts coughing 10a p getting to work at 730, seems worse talking/ working/ goes away p gets home from work, lives alone and no talking at home vs lots at work.  Dry cough, some sneezing   sometimes chokes on drinks.   Cough better in [place] and [place] for up to a week, resumed when went back to work. No better with saba. rec Cyclical  cough rx Keep previous appointment and stay on singulair and reflux medications.        11/30/2012 f/u ov/Cynthia Obrien still on singulair Chief Complaint  Patient presents with  . Follow-up    Pt states that her cough had improved for a few days while she was not working, but since started back working her cough is back where it was last visit.   cough never completely resolved even on max cyclical rx but seems best on high dose pred and when not working. Neurontin 100 mg three times daily and titrate up to max of 300 tid Stay on protonix and pepcid 20 mg at bedtime Prednisone 10 mg take  4 each am x 2 days,   2 each am x 2 days,  1 each am x2days and stop  Stop singulair  12/19/2012 f/u ov/Cynthia Obrien re cough/ resolved on neurontin 300 tid Chief Complaint  Patient presents with  . Acute Visit    Pt c/o edema in face, hands and feet for the past 5 days. She also c/o wheezing for the past 2 days.     No obvious daytime variabilty to "wheezing" or assoc excess mucus or cp or chest tightness, subjective wheeze overt sinus or hb symptoms. No unusual exp hx or  h/o childhood pna/ asthma or premature birth to her knowledge.  Cough better at hs.  Sleeping ok without nocturnal  or early am exacerbation  of respiratory  c/o's or need for noct saba. Also denies any obvious fluctuation of symptoms with weather or environmental changes or other aggravating or alleviating factors except as outlined above   Current Medications, Allergies, Past Medical History, Past Surgical History, Family History, and Social History were reviewed in [employer] record.  ROS  The following are not active complaints unless bolded sore throat, dysphagia, dental problems, itching, sneezing,  nasal congestion or excess/ purulent secretions, ear ache,   fever, chills, sweats, unintended wt loss, pleuritic or exertional cp, hemoptysis,  orthopnea pnd or leg swelling, presyncope, palpitations, heartburn, abdominal  pain, anorexia, nausea, vomiting, diarrhea  or change in bowel or urinary habits, change in stools or urine, dysuria,hematuria,  rash, arthralgias, visual complaints, headache, numbness weakness or ataxia or problems with walking or coordination,  change in mood/affect or memory.         Objective:   Physical Exam    hoarse amb obese  wf nad  11/14/2012      305 > 11/30/2012  306> 12/19/2012 315  Wt Readings from Last 3 Encounters:  10/21/12 309 lb 6.4 oz (140.343 kg)  09/09/12 303 lb (137.44 kg)  07/28/12 300 lb (136.079 kg)   HEENT: nl dentition, turbinates, and orophanx. Nl external ear canals without cough reflex   NECK :  without JVD/Nodes/TM/ nl carotid upstrokes bilaterally   LUNGS: no acc muscle use, clear to A and P bilaterally without cough on insp or exp maneuvers   CV:  RRR  no s3 or murmur or increase in P2, pos trace bilateral edema   ABD:  soft and nontender with nl excursion in the supine position. No bruits or organomegaly, bowel sounds nl  MS:  warm without deformities, calf tenderness, cyanosis or clubbing     CXR  11/14/2012 :  Borderline cardiomegaly, with some exaggeration of the heart size likely secondary to the low lung volumes. Otherwise negative.     Assessment:           Plan:

## 2012-12-19 NOTE — Patient Instructions (Addendum)
Add aldactone (spirolactone) 50 mg one daily with lasix as needed excess swelling but hold potassium while on aldactone  neurontin reduce to 300 mg each am or 100 mg three times a day  Please remember to go to the lab   department downstairs for your tests - we will call you with the results when they are available.     Change follow up to one month call sooner if needed

## 2012-12-20 NOTE — Assessment & Plan Note (Signed)
Assoc with subj wheeze and hbp  mild fluid overload on exam but not note BNP nl  Will try adding adlactone 50 mg daily to lasix empirically

## 2012-12-20 NOTE — Assessment & Plan Note (Signed)
-   onset May 2013  ? 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 Adding to swelling  Since cough now resolved should be able to tolerate self titration of neurontin down if not off See instructions for specific recommendations which were reviewed directly with the patient who was given a copy with highlighter outlining the key components.

## 2012-12-20 NOTE — Assessment & Plan Note (Signed)
Lab Results  Component Value Date   TSH 0.48 12/19/2012     Adequate control on present rx, reviewed no need to change rx

## 2012-12-26 ENCOUNTER — Ambulatory Visit: Payer: 59 | Admitting: Internal Medicine

## 2012-12-28 ENCOUNTER — Ambulatory Visit: Payer: 59 | Admitting: Internal Medicine

## 2012-12-29 ENCOUNTER — Telehealth: Payer: Self-pay | Admitting: Internal Medicine

## 2012-12-29 NOTE — Telephone Encounter (Signed)
I spoke with pt and made her aware of MW recs. She voiced her understanding and appt made. Nothing further was needed

## 2012-12-29 NOTE — Telephone Encounter (Signed)
Returning call can be reached at [phone].Raylene Everts

## 2012-12-29 NOTE — Telephone Encounter (Signed)
Sounds like the neurontin may not be the cause of the fluid and was helping the cough so ok to resume 300 mg tid and take the aldactone bid plus lasix bid and ov with Tammy NP in one week for recheck labs - we can do thyroid then also (other option is see Sharl Ma in one week and he can check thyroid and kidney function and potassium all at once).

## 2012-12-29 NOTE — Telephone Encounter (Signed)
Patient Instructions  12/19/2012 appt with MW    Add aldactone (spirolactone) 50 mg one daily with lasix as needed excess swelling but hold potassium while on aldactone  neurontin reduce to 300 mg each am or 100 mg three times a day  Please remember to go to the lab department downstairs for your tests - we will call you with the results when they are available.  Change follow up to one month call sooner if needed   LMOM x 1

## 2012-12-29 NOTE — Telephone Encounter (Signed)
Pt states that last OV Dr Sherene Sires changed some medications: Patient Instructions 12/19/2012 appt with MW     Add aldactone (spirolactone) 50 mg one daily with lasix as needed excess swelling but hold potassium while on aldactone  neurontin reduce to 300 mg each am or 100 mg three times a day  Please remember to go to the lab department downstairs for your tests - we will call you with the results when they are available.  Change follow up to one month call sooner if needed   Pt states that as soon as she decreases her Neurontin her cough returns And with the new fluid pill she is still having swelling.  Pt would like to know if these problems could be coming from her Thyroid numbers being out of range. Dr Sharl Ma @ [facility] has been following her thyroid.  Dr Sherene Sires please advise if pt needs apt or if any other recs can be made. Thanks.

## 2013-01-04 ENCOUNTER — Ambulatory Visit (INDEPENDENT_AMBULATORY_CARE_PROVIDER_SITE_OTHER): Payer: 59 | Admitting: Adult Health

## 2013-01-04 ENCOUNTER — Other Ambulatory Visit (INDEPENDENT_AMBULATORY_CARE_PROVIDER_SITE_OTHER): Payer: 59

## 2013-01-04 ENCOUNTER — Encounter: Payer: Self-pay | Admitting: Adult Health

## 2013-01-04 VITALS — BP 118/78 | HR 75 | Temp 98.0°F | Ht 64.5 in | Wt 305.0 lb

## 2013-01-04 DIAGNOSIS — R059 Cough, unspecified: Secondary | ICD-10-CM

## 2013-01-04 DIAGNOSIS — R609 Edema, unspecified: Secondary | ICD-10-CM

## 2013-01-04 DIAGNOSIS — E039 Hypothyroidism, unspecified: Secondary | ICD-10-CM

## 2013-01-04 DIAGNOSIS — R05 Cough: Secondary | ICD-10-CM

## 2013-01-04 LAB — BASIC METABOLIC PANEL
BUN: 14 mg/dL (ref 6–23)
Chloride: 98 mEq/L (ref 96–112)
Creatinine, Ser: 1.2 mg/dL (ref 0.4–1.2)
GFR: 52.71 mL/min — ABNORMAL LOW (ref >60.00)

## 2013-01-04 LAB — TSH: TSH: 0.32 u[IU]/mL — ABNORMAL LOW (ref 0.35–5.50)

## 2013-01-04 NOTE — Progress Notes (Signed)
Subjective:     Patient ID: Cynthia Obrien, female   DOB: Feb 06, 1966  MRN: [id number]    Brief patient profile:  47 yowf quit smoking 1999 with lifelong problems with seasaol allergy in spring consisting of sneezing running nose with pollen and always resolves when when pollen goes away, never assoc with asthma or cough, self referred  10/21/2012 to [facility] pulmonary clinic for chronic cough  10/21/2012 1st pulmonary ov/ Wert cc abrupt onset May 2013 w/in a few days of starting toprol > change to bystolic > change CCB no change. Assoc with 70 lb wt gain and mild doe proportionate to wt gain or coughing. rec Cyclical coug rx plus  Chlortrimeton at hs prn    11/14/2012 f/u ov/Wert chronic cough Chief Complaint  Patient presents with  . Follow-up    Cough had imrproved alot while on prednisone, but when finished cough started coming back. She states cough still not as severe today as her first visit.   cough 100% gone off cough meds and on ppi 20 mg qd but w/in 3 days of stop prednisone same cough as usual recurred as in past rx with prednisone.  Has not tried singulair having some noct wheeze but daytime, no notc, cough dry in character.  Her hoarseness better, no better p saba in past rec Add back pepcid 20 mg at bedtime Singulair 10 mg one each pm Prednisone 10 mg take  4 each am x 2 days,   2 each am x 2 days,  1 each am x2days and stop     11/18/2012 f/u ov/Wert re cough Chief Complaint  Patient presents with  . Acute Visit    Cough no better at all since the last visit, still non prod and gets worse as the day progresses.  get up around 7am, then starts coughing 10a p getting to work at 730, seems worse talking/ working/ goes away p gets home from work, lives alone and no talking at home vs lots at work.  Dry cough, some sneezing   sometimes chokes on drinks.   Cough better in [place] and [place] for up to a week, resumed when went back to work. No better with saba. rec Cyclical  cough rx Keep previous appointment and stay on singulair and reflux medications.        11/30/2012 f/u ov/Wert still on singulair Chief Complaint  Patient presents with  . Follow-up    Pt states that her cough had improved for a few days while she was not working, but since started back working her cough is back where it was last visit.   cough never completely resolved even on max cyclical rx but seems best on high dose pred and when not working. Neurontin 100 mg three times daily and titrate up to max of 300 tid Stay on protonix and pepcid 20 mg at bedtime Prednisone 10 mg take  4 each am x 2 days,   2 each am x 2 days,  1 each am x2days and stop  Stop singulair  12/19/2012 f/u ov/Wert re cough/ resolved on neurontin 300 tid Chief Complaint  Patient presents with  . Acute Visit    Pt c/o edema in face, hands and feet for the past 5 days. She also c/o wheezing for the past 2 days.   >>added spironolactone     01/04/2013 Follow up  Returns for 2 week follow up for cough .  Last ov neurontin adjusted.  She was having difficulty  with fluid retention and swelling.  Cough is better on Neurontin 300mg  Three times a day   She was started on spironolactone .  Edema is much better. Wt is down 10lbs.  Using mountain dew to sip on. Advised on GERD diet .  No hemoptysis, chest pain or n/v. No fever.    ROS Reviewed,neg except as in HPI    Objective:   Physical Exam    hoarse amb obese  wf nad  11/14/2012      305 > 11/30/2012  306> 12/19/2012 315 >305 01/04/2013  HEENT: nl dentition, turbinates, and orophanx. Nl external ear canals without cough reflex   NECK :  without JVD/Nodes/TM/ nl carotid upstrokes bilaterally   LUNGS: no acc muscle use, clear to A and P bilaterally without cough on insp or exp maneuvers   CV:  RRR  no s3 or murmur or increase in P2, pos trace bilateral edema   ABD:  soft and nontender with nl excursion in the supine position. No bruits or organomegaly, bowel  sounds nl  MS:  warm without deformities, calf tenderness, cyanosis or clubbing     CXR  11/14/2012 :  Borderline cardiomegaly, with some exaggeration of the heart size likely secondary to the low lung volumes. Otherwise negative.     Assessment:           Plan:

## 2013-01-04 NOTE — Patient Instructions (Addendum)
Continue on current regimen  May add Delsym to help with cough control  Continue to use Tramadol 50mg  every 4hours for breakthrough cough.  GET AHEAD OF COUGH-TRY TO NOT COUGH OR CLEAR THROAT  AVOID SODAS AND MINTS  Use sugarless candy, sips of waters to soothe throat.  I will call with lab results and decide on diuretic dose.  Follow up Dr. Sherene Sires  3 weeks and As needed

## 2013-01-06 ENCOUNTER — Telehealth: Payer: Self-pay | Admitting: Internal Medicine

## 2013-01-06 NOTE — Assessment & Plan Note (Signed)
Cough improved on present regimen.  Edema resolved on diuretics. (?edema side effect of high dose neurontin)   Check bmet today.   Plan  Continue on current regimen  May add Delsym to help with cough control  Continue to use Tramadol 50mg  every 4hours for breakthrough cough.  GET AHEAD OF COUGH-TRY TO NOT COUGH OR CLEAR THROAT  AVOID SODAS AND MINTS  Use sugarless candy, sips of waters to soothe throat.  I will call with lab results and decide on diuretic dose.  Follow up Dr. Sherene Sires  3 weeks and As needed

## 2013-01-06 NOTE — Telephone Encounter (Signed)
Notes Recorded by Julio Sicks, NP on 01/04/2013 at 12:45 PM Labs are ok,  Would decrease Lasix 40mg  daily  Cont on Aldactone Twice daily  follow up in 3 weeks with Dr. Sherene Sires And As needed   Thyroid test slightly abnormal -fax to PCP , needs follow up with PCP to discuss if further testing is indicated.    I spoke with patient about results and she verbalized understanding and had no questions.lab faxed over to Dr. Sharl Ma per pt requests

## 2013-01-06 NOTE — Progress Notes (Signed)
Quick Note:  LMOM TCB x1. ______ 

## 2013-01-18 ENCOUNTER — Other Ambulatory Visit: Payer: Self-pay | Admitting: Internal Medicine

## 2013-01-18 MED ORDER — PANTOPRAZOLE SODIUM 40 MG PO TBEC: 40.0000 mg | DELAYED_RELEASE_TABLET | Freq: Every day | ORAL | Status: DC

## 2013-01-18 MED ORDER — TRAMADOL HCL 50 MG PO TABS: ORAL_TABLET | ORAL | Status: DC

## 2013-01-25 ENCOUNTER — Ambulatory Visit: Payer: 59 | Admitting: Internal Medicine

## 2013-02-20 ENCOUNTER — Encounter: Payer: Self-pay | Admitting: Internal Medicine

## 2013-02-20 ENCOUNTER — Ambulatory Visit (INDEPENDENT_AMBULATORY_CARE_PROVIDER_SITE_OTHER): Payer: 59 | Admitting: Internal Medicine

## 2013-02-20 VITALS — BP 132/80 | HR 73 | Temp 97.9°F | Ht 64.5 in | Wt 301.8 lb

## 2013-02-20 DIAGNOSIS — R05 Cough: Secondary | ICD-10-CM

## 2013-02-20 MED ORDER — GABAPENTIN 300 MG PO CAPS: 300.0000 mg | ORAL_CAPSULE | Freq: Three times a day (TID) | ORAL | Status: DC

## 2013-02-20 MED ORDER — SPIRONOLACTONE 50 MG PO TABS: ORAL_TABLET | ORAL | Status: DC

## 2013-02-20 NOTE — Patient Instructions (Addendum)
Leave off the protonix and pepcid unless cough becomes severe  Ok to use the chlortrimeton 4 mg otc as needed for nasal drainage, throat irritation   Restart neurontin 300 mg three times daily   Please see patient coordinator before you leave today  to schedule methacholine challenge

## 2013-02-20 NOTE — Progress Notes (Signed)
Subjective:     Patient ID: Cynthia Obrien, female   DOB: 02/19/1966  MRN: [id number]    Brief patient profile:  47 yowf quit smoking 1999 with lifelong problems with seasonal  allergy in spring consisting of sneezing running nose with pollen and always resolves when when pollen goes away, never assoc with asthma or cough, self referred  10/21/2012 to [facility] pulmonary clinic for chronic cough  10/21/2012 1st pulmonary ov/ Cynthia Obrien cc abrupt onset May 2013 w/in a few days of starting toprol > change to bystolic > change CCB no change. Assoc with 70 lb wt gain and mild doe proportionate to wt gain or coughing. rec Cyclical coug rx plus  Chlortrimeton at hs prn    11/14/2012 f/u ov/Cynthia Obrien chronic cough Chief Complaint  Patient presents with  . Follow-up    Cough had imrproved alot while on prednisone, but when finished cough started coming back. She states cough still not as severe today as her first visit.   cough 100% gone off cough meds and on ppi 20 mg qd but w/in 3 days of stop prednisone same cough as usual recurred as in past rx with prednisone.  Has not tried singulair having some noct wheeze but daytime, no noct, cough dry in character.  Her hoarseness better, no better p saba in past rec Add back pepcid 20 mg at bedtime Singulair 10 mg one each pm Prednisone 10 mg take  4 each am x 2 days,   2 each am x 2 days,  1 each am x2days and stop     11/18/2012 f/u ov/Cynthia Obrien re cough Chief Complaint  Patient presents with  . Acute Visit    Cough no better at all since the last visit, still non prod and gets worse as the day progresses.  get up around 7am, then starts coughing 10a p getting to work at 730, seems worse talking/ working/ goes away p gets home from work, lives alone and no talking at home vs lots at work.  Dry cough, some sneezing   sometimes chokes on drinks.   Cough better in [place] and [place] for up to a week, resumed when went back to work. No better with  saba. rec Cyclical cough rx Keep previous appointment and stay on singulair and reflux medications.        11/30/2012 f/u ov/Cynthia Obrien still on singulair Chief Complaint  Patient presents with  . Follow-up    Pt states that her cough had improved for a few days while she was not working, but since started back working her cough is back where it was last visit.   cough never completely resolved even on max cyclical rx but seems best on high dose pred and when not working. Neurontin 100 mg three times daily and titrate up to max of 300 tid Stay on protonix and pepcid 20 mg at bedtime Prednisone 10 mg take  4 each am x 2 days,   2 each am x 2 days,  1 each am x2days and stop  Stop singulair  12/19/2012 f/u ov/Cynthia Obrien re cough/ resolved on neurontin 300 tid Chief Complaint  Patient presents with  . Acute Visit    Pt c/o edema in face, hands and feet for the past 5 days. She also c/o wheezing for the past 2 days.   >> Add aldactone (spirolactone) 50 mg one daily with lasix as needed excess swelling but hold potassium while on aldactone neurontin reduce to 300 mg each am or  100 mg three times a day    01/04/2013 Follow up  Returns for 2 week follow up for cough .  Last ov neurontin adjusted.  She was having difficulty with fluid retention and swelling.  Cough is better on Neurontin 300mg  Three times a day   She was started on spironolactone .  Edema is much better. Wt is down 10lbs.  Using mountain dew to sip on. Advised on GERD diet .  No hemoptysis, chest pain or n/v. No fever.  rec Continue on current regimen  May add Delsym to help with cough control  Continue to use Tramadol 50mg  every 4hours for breakthrough cough.  GET AHEAD OF COUGH-TRY TO NOT COUGH OR CLEAR THROAT  AVOID SODAS AND MINTS  Use sugarless candy, sips of waters to soothe throat.  I will call with lab results and decide on diuretic dose.    02/20/2013 f/u ov/Cynthia Obrien re cough Chief Complaint  Patient presents with  .  Follow-up    Cough has improved since the last visit. No new co's today  first day back at work, maintained on neurontin 300 mg tid until  July 7th and then abruptly stopped as ran out Albuterol has not helped in past Has stopped all the meds and  Dry day > noct cough starting to come back since arrived back home from [place] after flying 8/8 and worse 8/10  Did not try chlortrimeton yet  No obvious daytime variabilty or assoc sob or cp or chest tightness, subjective wheeze overt sinus or hb symptoms. No unusual exp hx or h/o childhood pna/ asthma or knowledge of premature birth.   Sleeping ok without nocturnal  or early am exacerbation  of respiratory  c/o's or need for noct saba. Also denies any obvious fluctuation of symptoms with weather or environmental changes or other aggravating or alleviating factors except as outlined above   Current Medications, Allergies, Past Medical History, Past Surgical History, Family History, and Social History were reviewed in [employer] record.  ROS  The following are not active complaints unless bolded sore throat, dysphagia, dental problems, itching, sneezing,  nasal congestion or excess/ purulent secretions, ear ache,   fever, chills, sweats, unintended wt loss, pleuritic or exertional cp, hemoptysis,  orthopnea pnd or leg swelling, presyncope, palpitations, heartburn, abdominal pain, anorexia, nausea, vomiting, diarrhea  or change in bowel or urinary habits, change in stools or urine, dysuria,hematuria,  rash, arthralgias, visual complaints, headache, numbness weakness or ataxia or problems with walking or coordination,  change in mood/affect or memory.                 Objective:   Physical Exam  hoarse amb obese  wf nad   11/14/2012      305 > 11/30/2012  306> 12/19/2012 315 >305 01/04/2013 > 302 02/20/2013    HEENT: nl dentition, turbinates, and orophanx. Nl external ear canals without cough reflex   NECK :  without  JVD/Nodes/TM/ nl carotid upstrokes bilaterally   LUNGS: no acc muscle use, clear to A and P bilaterally without cough on insp or exp maneuvers   CV:  RRR  no s3 or murmur or increase in P2, pos trace bilateral edema   ABD:  soft and nontender with nl excursion in the supine position. No bruits or organomegaly, bowel sounds nl  MS:  warm without deformities, calf tenderness, cyanosis or clubbing     CXR  11/14/2012 :  Borderline cardiomegaly, with some exaggeration of the heart  size likely secondary to the low lung volumes. Otherwise negative.     Assessment:

## 2013-02-21 NOTE — Assessment & Plan Note (Addendum)
-   onset May 2013  ? 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 Adding to swelling  Even if this turns out to be allergic, though cough has only been adequately controlled on high dose neurontin stronlgy suggestive of   Classic Upper airway cough syndrome, so named because it's frequently impossible to sort out how much is  CR/sinusitis with freq throat clearing (which can be related to primary GERD)   vs  causing  secondary (" extra esophageal")  GERD from wide swings in gastric pressure that occur with throat clearing, often  promoting self use of mint and menthol lozenges that reduce the lower esophageal sphincter tone and exacerbate the problem further in a cyclical fashion.   These are the same pts (now being labeled as having "irritable larynx syndrome" by some cough centers) who not infrequently have a history of having failed to tolerate ace inhibitors,  dry powder inhalers or biphosphonates or report having atypical reflux symptoms that don't respond to standard doses of PPI , and are easily confused as having aecopd or asthma flares by even experienced allergists/ pulmonologists.   Will rechallenge with neurontin 300 mg tid and this time try it without gerd rx but add 1st gen H1 per guidelines

## 2013-02-27 ENCOUNTER — Ambulatory Visit: Payer: 59 | Admitting: Internal Medicine

## 2013-02-28 ENCOUNTER — Telehealth: Payer: Self-pay | Admitting: Internal Medicine

## 2013-02-28 ENCOUNTER — Ambulatory Visit (HOSPITAL_COMMUNITY)
Admission: RE | Admit: 2013-02-28 | Discharge: 2013-02-28 | Disposition: A | Discharge status: Home / Self Care | Payer: 59 | Source: Ambulatory Visit | Attending: Internal Medicine | Admitting: Internal Medicine

## 2013-02-28 DIAGNOSIS — R059 Cough, unspecified: Secondary | ICD-10-CM | POA: Insufficient documentation

## 2013-02-28 DIAGNOSIS — R05 Cough: Secondary | ICD-10-CM | POA: Insufficient documentation

## 2013-02-28 MED ORDER — METHACHOLINE 16 MG/ML NEB SOLN
2.0000 mL | Freq: Once | RESPIRATORY_TRACT | Status: AC
Start: 2013-02-28 — End: 2013-02-28
  Administered 2013-02-28: 32 mg via RESPIRATORY_TRACT

## 2013-02-28 MED ORDER — ALBUTEROL SULFATE (5 MG/ML) 0.5% IN NEBU
2.5000 mg | INHALATION_SOLUTION | Freq: Once | RESPIRATORY_TRACT | Status: AC
  Administered 2013-02-28: 2.5 mg via RESPIRATORY_TRACT

## 2013-02-28 MED ORDER — METHACHOLINE 4 MG/ML NEB SOLN
2.0000 mL | Freq: Once | RESPIRATORY_TRACT | Status: AC
  Administered 2013-02-28: 8 mg via RESPIRATORY_TRACT

## 2013-02-28 MED ORDER — SODIUM CHLORIDE 0.9 % IN NEBU
3.0000 mL | INHALATION_SOLUTION | Freq: Once | RESPIRATORY_TRACT | Status: AC
  Administered 2013-02-28: 3 mL via RESPIRATORY_TRACT

## 2013-02-28 MED ORDER — METHACHOLINE 1 MG/ML NEB SOLN
2.0000 mL | Freq: Once | RESPIRATORY_TRACT | Status: AC
  Administered 2013-02-28: 2 mg via RESPIRATORY_TRACT

## 2013-02-28 MED ORDER — METHACHOLINE 0.0625 MG/ML NEB SOLN
2.0000 mL | Freq: Once | RESPIRATORY_TRACT | Status: AC
  Administered 2013-02-28: 0.125 mg via RESPIRATORY_TRACT

## 2013-02-28 MED ORDER — METHACHOLINE 0.25 MG/ML NEB SOLN
2.0000 mL | Freq: Once | RESPIRATORY_TRACT | Status: AC
  Administered 2013-02-28: 0.5 mg via RESPIRATORY_TRACT

## 2013-02-28 NOTE — Telephone Encounter (Signed)
Pt informed okay to take Neurontin prior to Methacholine challenge.

## 2013-03-03 ENCOUNTER — Telehealth: Payer: Self-pay | Admitting: Internal Medicine

## 2013-03-03 DIAGNOSIS — R0600 Dyspnea, unspecified: Secondary | ICD-10-CM

## 2013-03-03 NOTE — Telephone Encounter (Signed)
Spoke with patient; states she continues to have cough after recs from [facility]. Had meth challenge 02-28-13 and would like results/recs and when to follow up with MW.

## 2013-03-03 NOTE — Telephone Encounter (Signed)
See if the pft lab will send me the raw data now and let her know I will call her as soon as I get a chance to review it

## 2013-03-03 NOTE — Telephone Encounter (Signed)
I called and requested results be faxed to triage. Will await fax. Carron Curie, CMA

## 2013-03-06 NOTE — Telephone Encounter (Signed)
I have note seen fax in triage. I ATC Michelle at [employer] to see if she faxed it and to where, NA. WCB. Carron Curie, CMA

## 2013-03-08 NOTE — Telephone Encounter (Signed)
Results received and place din MW look at 

## 2013-03-08 NOTE — Telephone Encounter (Signed)
i went over the preliminary results with her - she did not have a response that mimicked her symptoms

## 2013-03-08 NOTE — Telephone Encounter (Signed)
I spoke with Baxter Hire and she is faxing this to triage will await fax

## 2013-03-08 NOTE — Telephone Encounter (Signed)
LM for pft dept to call our office regarding Methacholine challenge results

## 2013-04-06 ENCOUNTER — Ambulatory Visit (INDEPENDENT_AMBULATORY_CARE_PROVIDER_SITE_OTHER): Payer: 59 | Admitting: Internal Medicine

## 2013-04-06 ENCOUNTER — Encounter: Payer: Self-pay | Admitting: Internal Medicine

## 2013-04-06 VITALS — BP 106/70 | HR 75 | Temp 98.0°F | Ht 65.0 in | Wt 303.8 lb

## 2013-04-06 DIAGNOSIS — R05 Cough: Secondary | ICD-10-CM

## 2013-04-06 DIAGNOSIS — Z23 Encounter for immunization: Secondary | ICD-10-CM

## 2013-04-06 NOTE — Progress Notes (Signed)
Subjective:     Patient ID: Cynthia Obrien, female   DOB: 1966-07-06  MRN: [id number]    Brief patient profile:  47 yowf quit smoking 1999 with lifelong problems with seasonal  allergy in spring consisting of sneezing running nose with pollen and always resolves when when pollen goes away, never assoc with asthma or cough, self referred  10/21/2012 to [facility] pulmonary clinic for chronic cough  10/21/2012 1st pulmonary ov/ Wert cc abrupt onset May 2013 w/in a few days of starting toprol > change to bystolic > change CCB no change. Assoc with 70 lb wt gain and mild doe proportionate to wt gain or coughing. rec Cyclical coug rx plus  Chlortrimeton at hs prn    11/14/2012 f/u ov/Wert chronic cough Chief Complaint  Patient presents with  . Follow-up    Cough had imrproved alot while on prednisone, but when finished cough started coming back. She states cough still not as severe today as her first visit.   cough 100% gone off cough meds and on ppi 20 mg qd but w/in 3 days of stop prednisone same cough as usual recurred as in past rx with prednisone.  Has not tried singulair having some noct wheeze but daytime, no noct, cough dry in character.  Her hoarseness better, no better p saba in past rec Add back pepcid 20 mg at bedtime Singulair 10 mg one each pm Prednisone 10 mg take  4 each am x 2 days,   2 each am x 2 days,  1 each am x2days and stop     11/18/2012 f/u ov/Wert re cough Chief Complaint  Patient presents with  . Acute Visit    Cough no better at all since the last visit, still non prod and gets worse as the day progresses.  get up around 7am, then starts coughing 10a p getting to work at 730, seems worse talking/ working/ goes away p gets home from work, lives alone and no talking at home vs lots at work.  Dry cough, some sneezing   sometimes chokes on drinks.   Cough better in [place] and [place] for up to a week, resumed when went back to work. No better with  saba. rec Cyclical cough rx Keep previous appointment and stay on singulair and reflux medications.        11/30/2012 f/u ov/Wert still on singulair Chief Complaint  Patient presents with  . Follow-up    Pt states that her cough had improved for a few days while she was not working, but since started back working her cough is back where it was last visit.   cough never completely resolved even on max cyclical rx but seems best on high dose pred and when not working. Neurontin 100 mg three times daily and titrate up to max of 300 tid Stay on protonix and pepcid 20 mg at bedtime Prednisone 10 mg take  4 each am x 2 days,   2 each am x 2 days,  1 each am x2days and stop  Stop singulair  12/19/2012 f/u ov/Wert re cough/ resolved on neurontin 300 tid Chief Complaint  Patient presents with  . Acute Visit    Pt c/o edema in face, hands and feet for the past 5 days. She also c/o wheezing for the past 2 days.   >> Add aldactone (spirolactone) 50 mg one daily with lasix as needed excess swelling but hold potassium while on aldactone neurontin reduce to 300 mg each am or  100 mg three times a day    01/04/2013 Follow up  Returns for 2 week follow up for cough .  Last ov neurontin adjusted.  She was having difficulty with fluid retention and swelling.  Cough is better on Neurontin 300mg  Three times a day   She was started on spironolactone .  Edema is much better. Wt is down 10lbs.  Using mountain dew to sip on. Advised on GERD diet .  No hemoptysis, chest pain or n/v. No fever.  rec Continue on current regimen  May add Delsym to help with cough control  Continue to use Tramadol 50mg  every 4hours for breakthrough cough.  GET AHEAD OF COUGH-TRY TO NOT COUGH OR CLEAR THROAT  AVOID SODAS AND MINTS  Use sugarless candy, sips of waters to soothe throat.  I will call with lab results and decide on diuretic dose.    02/20/2013 f/u ov/Wert re cough Chief Complaint  Patient presents with  .  Follow-up    Cough has improved since the last visit. No new co's today  first day back at work, maintained on neurontin 300 mg tid until  July 7th and then abruptly stopped as ran out Albuterol has not helped in past Has stopped all the meds and  Dry day > noct cough starting to come back since arrived back home from [place] after flying 8/8 and worse 8/10  Did not try chlortrimeton yet rec Leave off the protonix and pepcid unless cough becomes severe Ok to use the chlortrimeton 4 mg otc as needed for nasal drainage, throat irritation  Restart neurontin 300 mg three times daily    04/06/2013 f/u ov/Wert re: chronic cough / responsive to neurontin Chief Complaint  Patient presents with  . Follow-up    Pt states cough has improved since last visit. No new co's today.    back at work, found grass mold only, not "black mold" at work Still feels  better when not at [place] vs in [place] whether at work or not   No obvious daytime variabilty or assoc sob or cp or chest tightness, subjective wheeze overt sinus or hb symptoms. No unusual exp hx or h/o childhood pna/ asthma or knowledge of premature birth.   Sleeping ok without nocturnal  or early am exacerbation  of respiratory  c/o's or need for noct saba. Also denies any obvious fluctuation of symptoms with weather or environmental changes or other aggravating or alleviating factors except as outlined above   Current Medications, Allergies, Past Medical History, Past Surgical History, Family History, and Social History were reviewed in [employer] record.  ROS  The following are not active complaints unless bolded sore throat, dysphagia, dental problems, itching, sneezing,  nasal congestion or excess/ purulent secretions, ear ache,   fever, chills, sweats, unintended wt loss, pleuritic or exertional cp, hemoptysis,  orthopnea pnd or leg swelling, presyncope, palpitations, heartburn, abdominal pain, anorexia, nausea, vomiting, diarrhea   or change in bowel or urinary habits, change in stools or urine, dysuria,hematuria,  rash, arthralgias, visual complaints, headache, numbness weakness or ataxia or problems with walking or coordination,  change in mood/affect or memory.                 Objective:   Physical Exam    amb obese  wf nad   11/14/2012      305 > 11/30/2012  306> 12/19/2012 315 >305 01/04/2013 > 302 02/20/2013 > 04/06/2013  304    HEENT: nl dentition,  turbinates, and orophanx. Nl external ear canals without cough reflex   NECK :  without JVD/Nodes/TM/ nl carotid upstrokes bilaterally   LUNGS: no acc muscle use, clear to A and P bilaterally without cough on insp or exp maneuvers   CV:  RRR  no s3 or murmur or increase in P2, pos trace bilateral edema   ABD:  soft and nontender with nl excursion in the supine position. No bruits or organomegaly, bowel sounds nl  MS:  warm without deformities, calf tenderness, cyanosis or clubbing     CXR  11/14/2012 :  Borderline cardiomegaly, with some exaggeration of the heart size likely secondary to the low lung volumes. Otherwise negative.     Assessment:

## 2013-04-06 NOTE — Patient Instructions (Addendum)
Clearly by your history there is something at work that makes you cough, you may need to move into a different space within your office complex to see if this helps.   Stay on neurontin 300 mg three times daily for now  We will refer you to Dr Maple Hudson for allergy testing - we will refill your medications in meantime

## 2013-04-09 NOTE — Assessment & Plan Note (Addendum)
-   onset May 2013  ? 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 Adding to swelling - MCT 02/28/13 clinically neg > - Referred for allergy testing 9/26/ 14 >>>  Adequate control on present rx, reviewed > no change in rx needed  For now but since she repeatedly flares with the cough in [place] strongly suspect an allergic component despite neg MCT study for asthma.> referred

## 2013-04-10 ENCOUNTER — Encounter: Payer: Self-pay | Admitting: Internal Medicine

## 2013-05-26 ENCOUNTER — Institutional Professional Consult (permissible substitution): Payer: 59 | Admitting: Internal Medicine

## 2013-06-26 ENCOUNTER — Other Ambulatory Visit: Payer: 59

## 2013-06-26 ENCOUNTER — Ambulatory Visit (INDEPENDENT_AMBULATORY_CARE_PROVIDER_SITE_OTHER): Payer: 59 | Admitting: Internal Medicine

## 2013-06-26 ENCOUNTER — Encounter: Payer: Self-pay | Admitting: Internal Medicine

## 2013-06-26 VITALS — BP 128/86 | HR 67 | Ht 64.5 in | Wt 304.0 lb

## 2013-06-26 DIAGNOSIS — R05 Cough: Secondary | ICD-10-CM

## 2013-06-26 DIAGNOSIS — R059 Cough, unspecified: Secondary | ICD-10-CM

## 2013-06-26 DIAGNOSIS — J309 Allergic rhinitis, unspecified: Secondary | ICD-10-CM

## 2013-06-26 DIAGNOSIS — J302 Other seasonal allergic rhinitis: Secondary | ICD-10-CM

## 2013-06-26 NOTE — Progress Notes (Signed)
06/26/13- 67 yoF former smoker referred courtesy of Dr Sherene Cynthia Obrien for allergy evaluation Referred by Dr Sherene Cynthia Obrien for continuous nonprod cough and hoarseness X 4yrs.  Only bothers pt when at work.  Also, eczema has gotten worse in past yr. 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 She describes exposure to smoke, fumes, and acid makes used for treating aluminum, aluminum dust. A skylight has leaked and she suspects mold. She did a mold assay but does not understand the report. She gives a history of seasonal allergic rhinitis/hay fever with sore throats and hoarseness, itching eyes and nasal drainage especially spring and fall. She is better away from work. Describes throat tightness and burning after 2 hours back at work. Prednisone has helped when she was on it but benefit does not last once she stops. Previous allergy profile showed elevations for grass and tree pollens.  Prior to Admission medications   Medication Sig Start Date End Date Taking? Authorizing Provider  aspirin 81 MG tablet Take 81 mg by mouth daily.   Yes Historical Provider, MD  diltiazem (CARDIZEM CD) 120 MG 24 hr capsule Take 1 capsule (120 mg total) by mouth daily. 09/09/12  Yes Lewayne Bunting, MD  furosemide (LASIX) 40 MG tablet Take 40 mg by mouth daily.    Yes Historical Provider, MD  gabapentin (NEURONTIN) 300 MG capsule Take 1 capsule (300 mg total) by mouth 3 (three) times daily. 02/20/13  Yes Nyoka Cowden, MD  levothyroxine (SYNTHROID, LEVOTHROID) 100 MCG tablet Take 100 mcg by mouth daily before breakfast.   Yes Historical Provider, MD  spironolactone (ALDACTONE) 50 MG tablet One daily as needed swelling 02/20/13  Yes Nyoka Cowden, MD   Past Medical History  Diagnosis Date  . Hypothyroidism   . Hyperlipidemia   . SVT (supraventricular tachycardia)     a. 10/2011 Echo: EF 60-65%, Gr 2 DD.  Marland Kitchen COPD (chronic obstructive pulmonary disease)   . Lower extremity edema   . Fatigue   .  Joint ache   . Psoriasis    Past Surgical History  Procedure Laterality Date  . Lumbar disc surgery    . Ankle surgery    . Tonsillectomy     Family History  Problem Relation Age of Onset  . Heart disease Mother     MV repair; ICD  . Skin cancer Maternal Grandmother    History   Social History  . Marital Status: Single    Spouse Name: N/A    Number of Children: 0  . Years of Education: N/A   Occupational History  .      Quality engineer   Social History Main Topics  . Smoking status: Former Smoker -- 1.00 packs/day for 16 years    Types: Cigarettes    Quit date: 07/13/1997  . Smokeless tobacco: Not on file  . Alcohol Use: 4.2 oz/week    7 Glasses of wine per week     Comment: 1 glass of wine daily  . Drug Use: Not on file  . Sexual Activity: Not on file   Other Topics Concern  . Not on file   Social History Narrative  . No narrative on file   ROS-see HPI Constitutional:   +70 pound weight gain, no-night sweats, fevers, chills, fatigue, lassitude. HEENT:   No-  headaches, difficulty swallowing, +tooth/dental problems, sore throat,       + sneezing, +itching, ear ache, +nasal congestion, post nasal drip,  CV:  No-   chest pain, orthopnea, PND, swelling in lower extremities, anasarca,  dizziness, palpitations Resp: + shortness of breath with exertion or at rest.              No-   productive cough,  + non-productive cough,  No- coughing up of blood.              No-   change in color of mucus.  No- wheezing.   Skin: No-   rash or lesions. GI:  No-   heartburn, indigestion, abdominal pain, nausea, vomiting, diarrhea,                 change in bowel habits, loss of appetite GU: No-   dysuria, change in color of urine, no urgency or frequency.  No- flank pain. MS:  + joint pain or swelling.  No- decreased range of motion.  No- back pain. Neuro-     nothing unusual Psych:  No- change in mood or affect. No depression or anxiety.  No memory loss.  OBJ- Physical  Exam General- Alert, Oriented, Affect-appropriate, Distress- none acute, overweight Skin- rash-none, lesions- none, excoriation- none Lymphadenopathy- none Head- atraumatic            Eyes- Gross vision intact, PERRLA, conjunctivae and secretions clear            Ears- Hearing, canals-normal            Nose- Clear, no-Septal dev, mucus, polyps, erosion, perforation             Throat- Mallampati II , mucosa clear , drainage- none, tonsils- atrophic Neck- flexible , trachea midline, no stridor , thyroid nl, carotid no bruit Chest - symmetrical excursion , unlabored           Heart/CV- RRR , no murmur , no gallop  , no rub, nl s1 s2                           - JVD- none , edema- none, stasis changes- none, varices- none           Lung- clear to P&A, wheeze- none, cough- none , dullness-none, rub- none           Chest wall-  Abd- tender-no, distended-no, bowel sounds-present, HSM- no Br/ Gen/ Rectal- Not done, not indicated Extrem- cyanosis- none, clubbing, none, atrophy- none, strength- nl Neuro- grossly intact to observation

## 2013-06-26 NOTE — Patient Instructions (Signed)
Sample Breo Ellipta   1 puff then rinse mouth, once every day  Order lab- Allergy profile, Mold allergy profile dx     cough

## 2013-06-27 LAB — ALLERGY PROFILE REGION II-DC, DE, MD, ~~LOC~~, VA
Alternaria Alternata: <0.1 kU/L
Bermuda Grass: <0.1 kU/L
Cat Dander: <0.1 kU/L
Cockroach: <0.1 kU/L
Common Ragweed: <0.1 kU/L
Dog Dander: <0.1 kU/L
Elm IgE: <0.1 kU/L
Johnson Grass: <0.1 kU/L
Meadow Grass: <0.1 kU/L
Pecan/Hickory Tree IgE: <0.1 kU/L

## 2013-06-27 LAB — ALLERGY FULL PROFILE
Allergen, D pternoyssinus,d7: <0.1 kU/L
Aspergillus fumigatus, m3: <0.1 kU/L
Bahia Grass: <0.1 kU/L
Box Elder IgE: <0.1 kU/L
Common Ragweed: <0.1 kU/L
D. farinae: <0.1 kU/L
Elm IgE: <0.1 kU/L
G005 Rye, Perennial: <0.1 kU/L
G009 Red Top: <0.1 kU/L
House Dust Hollister: <0.1 kU/L
IgE (Immunoglobulin E), Serum: 42 IU/mL (ref 0.0–180.0)
Oak: <0.1 kU/L
Plantain: 0.4 kU/L — ABNORMAL HIGH
Sycamore Tree: <0.1 kU/L
Timothy Grass: <0.1 kU/L

## 2013-07-16 DIAGNOSIS — J3089 Other allergic rhinitis: Secondary | ICD-10-CM

## 2013-07-16 DIAGNOSIS — J302 Other seasonal allergic rhinitis: Secondary | ICD-10-CM | POA: Insufficient documentation

## 2013-07-16 NOTE — Assessment & Plan Note (Signed)
Impression: She relates cough to workplace exposure more closely than to her seasonal rhinitis. We discussed symptom control, but if possible it would be good to get away from her job exposure, but regardless of what the trigger is. Plan-full allergy profile and molds profile. Sample Breo Ellipta.

## 2013-07-16 NOTE — Assessment & Plan Note (Signed)
We can expand allergy profile and also get a mold allergy profile assessment. Molds can be indicators of dampness and may not be really causing problems, or causing problems by a nonallergic mechanism.

## 2013-07-28 ENCOUNTER — Other Ambulatory Visit: Payer: Self-pay | Admitting: *Deleted

## 2013-07-28 MED ORDER — FLUTICASONE FUROATE-VILANTEROL 100-25 MCG/INH IN AEPB: 1.0000 | INHALATION_SPRAY | Freq: Every day | RESPIRATORY_TRACT | Status: DC

## 2013-08-28 ENCOUNTER — Ambulatory Visit (INDEPENDENT_AMBULATORY_CARE_PROVIDER_SITE_OTHER): Payer: 59 | Admitting: Internal Medicine

## 2013-08-28 ENCOUNTER — Encounter: Payer: Self-pay | Admitting: Internal Medicine

## 2013-08-28 VITALS — BP 130/88 | HR 97 | Ht 64.5 in | Wt 299.0 lb

## 2013-08-28 DIAGNOSIS — R059 Cough, unspecified: Secondary | ICD-10-CM

## 2013-08-28 DIAGNOSIS — R05 Cough: Secondary | ICD-10-CM

## 2013-08-28 DIAGNOSIS — K219 Gastro-esophageal reflux disease without esophagitis: Secondary | ICD-10-CM

## 2013-08-28 MED ORDER — MONTELUKAST SODIUM 10 MG PO TABS: 10.0000 mg | ORAL_TABLET | Freq: Every day | ORAL | Status: DC

## 2013-08-28 NOTE — Progress Notes (Signed)
06/26/13- 85 yoF former smoker referred courtesy of Dr Melvyn Novas for allergy evaluation Referred by Dr Melvyn Novas for continuous nonprod cough and hoarseness X 51yrs.  Only bothers pt when at work.  Also, eczema has gotten worse in past yr. 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 She describes exposure to smoke, fumes, and acid makes used for treating aluminum, aluminum dust. A skylight has leaked and she suspects mold. She did a mold assay but does not understand the report. She gives a history of seasonal allergic rhinitis/hay fever with sore throats and hoarseness, itching eyes and nasal drainage especially spring and fall. She is better away from work. Describes throat tightness and burning after 2 hours back at work. Prednisone has helped when she was on it but benefit does not last once she stops. Previous allergy profile showed elevations for grass and tree pollens.  08/28/13- 77 yoF former smoker referred courtesy of Dr Melvyn Novas for allergy evaluation FOLLOWS FOR:  Feels asthough throat is closing up and having trouble swollowing, coughing at all times (work environment) x1 week At work gets episodic throat spasms without swelling. Happens once or twice per hour "like burning in throat". She does well at home and on weekends. Gets worse at work as she talks more. Breo Ellipta helped only for a while. Neurontin helped cough but made her depressed and retaining fluid Allergy Profile 06/26/13-total IgE 42 with elevations only for rescue grass and plantain.  ROS-see HPI Constitutional:   +70 pound weight gain, no-night sweats, fevers, chills, fatigue, lassitude. HEENT:   No-  headaches, difficulty swallowing, +tooth/dental problems, sore throat,       + sneezing, +itching, ear ache, +nasal congestion, post nasal drip, + hoarse CV:  No-   chest pain, orthopnea, PND, swelling in lower extremities, anasarca,  dizziness, palpitations Resp: + shortness of breath with  exertion or at rest.              No-   productive cough,  + non-productive cough,  No- coughing up of blood.              No-   change in color of mucus.  No- wheezing.   Skin: No-   rash or lesions. GI:  No-   heartburn, indigestion, abdominal pain, nausea, vomiting,  GU: is now in a MS:  + joint pain or swelling.  No- decreased range of motion.  No- back pain. Neuro-     nothing unusual Psych:  No- change in mood or affect. No depression or anxiety.  No memory loss.  OBJ- Physical Exam General- Alert, Oriented, Affect-appropriate, Distress- none acute, overweight Skin- rash-none, lesions- none, excoriation- none Lymphadenopathy- none Head- atraumatic            Eyes- Gross vision intact, PERRLA, conjunctivae and secretions clear            Ears- Hearing, canals-normal            Nose- Clear, no-Septal dev, mucus, polyps, erosion, perforation             Throat- Mallampati II , mucosa clear , drainage- none, tonsils- atrophic Neck- flexible , trachea midline, no stridor , thyroid nl, carotid no bruit Chest - symmetrical excursion , unlabored           Heart/CV- RRR , no murmur , no gallop  , no rub, nl s1 s2                           -  JVD- none , edema- none, stasis changes- none, varices- none           Lung- clear to P&A, wheeze- none, cough- none , dullness-none, rub- none           Chest wall-  Abd-  Br/ Gen/ Rectal- Not done, not indicated Extrem- cyanosis- none, clubbing, none, atrophy- none, strength- nl Neuro- grossly intact to observation

## 2013-08-28 NOTE — Patient Instructions (Signed)
Script for Singulair    1 daily airway anti-inflammatory  Sample Anoro    1 puff daily,  Try this instead of Breo. when it runs out, you can go back to [place] if it helps.

## 2013-09-11 ENCOUNTER — Other Ambulatory Visit: Payer: Self-pay | Admitting: Cardiology

## 2013-09-21 ENCOUNTER — Encounter: Payer: Self-pay | Admitting: Internal Medicine

## 2013-09-21 ENCOUNTER — Ambulatory Visit (INDEPENDENT_AMBULATORY_CARE_PROVIDER_SITE_OTHER): Payer: 59 | Admitting: Internal Medicine

## 2013-09-21 ENCOUNTER — Ambulatory Visit (INDEPENDENT_AMBULATORY_CARE_PROVIDER_SITE_OTHER)
Admission: RE | Admit: 2013-09-21 | Discharge: 2013-09-21 | Disposition: A | Discharge status: Home / Self Care | Payer: 59 | Source: Ambulatory Visit | Attending: Internal Medicine | Admitting: Internal Medicine

## 2013-09-21 VITALS — BP 122/88 | HR 64 | Ht 64.5 in | Wt 301.6 lb

## 2013-09-21 DIAGNOSIS — R490 Dysphonia: Secondary | ICD-10-CM

## 2013-09-21 DIAGNOSIS — J3089 Other allergic rhinitis: Secondary | ICD-10-CM

## 2013-09-21 DIAGNOSIS — R05 Cough: Secondary | ICD-10-CM

## 2013-09-21 DIAGNOSIS — J309 Allergic rhinitis, unspecified: Secondary | ICD-10-CM

## 2013-09-21 DIAGNOSIS — J302 Other seasonal allergic rhinitis: Secondary | ICD-10-CM

## 2013-09-21 DIAGNOSIS — R059 Cough, unspecified: Secondary | ICD-10-CM

## 2013-09-21 DIAGNOSIS — K219 Gastro-esophageal reflux disease without esophagitis: Secondary | ICD-10-CM

## 2013-09-21 MED ORDER — BECLOMETHASONE DIPROPIONATE 80 MCG/ACT NA AERS: 1.0000 | INHALATION_SPRAY | Freq: Two times a day (BID) | NASAL | Status: DC

## 2013-09-21 NOTE — Progress Notes (Signed)
06/26/13- 54 yoF former smoker referred courtesy of Dr Melvyn Novas for allergy evaluation Referred by Dr Melvyn Novas for continuous nonprod cough and hoarseness X 19yrs.  Only bothers pt when at work.  Also, eczema has gotten worse in past yr. 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 She describes exposure to smoke, fumes, and acid makes used for treating aluminum, aluminum dust. A skylight has leaked and she suspects mold. She did a mold assay but does not understand the report. She gives a history of seasonal allergic rhinitis/hay fever with sore throats and hoarseness, itching eyes and nasal drainage especially spring and fall. She is better away from work. Describes throat tightness and burning after 2 hours back at work. Prednisone has helped when she was on it but benefit does not last once she stops. Previous allergy profile showed elevations for grass and tree pollens.  08/28/13- 61 yoF former smoker referred courtesy of Dr Melvyn Novas for allergy evaluation FOLLOWS FOR:  Feels as though throat is closing up and having trouble swallowing, coughing at all times (work environment) x1 week Cough worse by end of February. Breo not good enough and Anoro actually seemed to make her worse. Dr Abbott Pao heard wheeze and gave Depo-Medrol yesterday. She is not aware of reflux and was treated for this before by Dr. Melvyn Novas without effect. Allergy Profile 06/26/13- total IgE 42 with elevations only for plaintain and fescue. A profile in May of 2014 head showed elevations also for a number of tree pollens and ragweed.  ROS-see HPI Constitutional:    no-night sweats, fevers, chills, fatigue, lassitude. HEENT:   No-  headaches, difficulty swallowing, +tooth/dental problems, sore throat,       No- sneezing, +itching, ear ache, +nasal congestion, post nasal drip,  CV:  No-   chest pain, orthopnea, PND, swelling in lower extremities, anasarca,  dizziness, palpitations Resp: + shortness of  breath with exertion or at rest.              No-   productive cough,  + non-productive cough,  No- coughing up of blood.              No-   change in color of mucus.  + wheezing.   Skin: No-   rash or lesions. GI:  No-   heartburn, indigestion, abdominal pain, nausea, vomiting,  GU:  MS:  + joint pain or swelling.   Neuro-     nothing unusual Psych:  No- change in mood or affect. No depression or anxiety.  No memory loss.  OBJ- Physical Exam General- Alert, Oriented, Affect-appropriate, Distress- none acute, overweight Skin- rash-none, lesions- none, excoriation- none Lymphadenopathy- none Head- atraumatic            Eyes- Gross vision intact, PERRLA, conjunctivae and secretions clear            Ears- Hearing, canals-normal            Nose- Clear, no-Septal dev, mucus, polyps, erosion, perforation             Throat- Mallampati II , mucosa clear , drainage- none, tonsils- atrophic, +hoarse Neck- flexible , trachea midline, no stridor , thyroid nl, carotid no bruit Chest - symmetrical excursion , unlabored           Heart/CV- RRR , no murmur , no gallop  , no rub, nl s1 s2                           -  JVD- none , edema- none, stasis changes- none, varices- none           Lung- clear to P&A, wheeze- none, cough- none , dullness-none, rub- none           Chest wall-  Abd-  Br/ Gen/ Rectal- Not done, not indicated Extrem- cyanosis- none, clubbing, none, atrophy- none, strength- nl Neuro- grossly intact to observation

## 2013-09-21 NOTE — Patient Instructions (Signed)
Sample Qnasl nasal spray    1 or 2 puffs each nostril twice daily  Order- referral to [facility] ENT dx hoarseness  Order CXR- dx Cough

## 2013-09-22 DIAGNOSIS — K219 Gastro-esophageal reflux disease without esophagitis: Secondary | ICD-10-CM | POA: Insufficient documentation

## 2013-09-22 NOTE — Assessment & Plan Note (Signed)
Discussed reflux precautions and emphasized importance of acid blocker

## 2013-09-22 NOTE — Assessment & Plan Note (Signed)
Throat discomfort is probably caused by reflux. Doubt significant allergy. Plan-try changing Breo Ellipta to Anoro inhaler to get away from the steroid component

## 2013-09-26 ENCOUNTER — Telehealth: Payer: Self-pay | Admitting: Internal Medicine

## 2013-09-26 NOTE — Telephone Encounter (Signed)
Pt is aware of normal CXR results.

## 2013-10-08 NOTE — Assessment & Plan Note (Signed)
She does not recognize symptoms of reflux or response to therapy

## 2013-10-08 NOTE — Assessment & Plan Note (Signed)
Wheeze has been more apparent in the past and reflux management has not helped, suggesting this is an asthma/bronchitis despite conflicting test results. Plan-voice rest, ENT laryngoscopy for hoarseness

## 2013-10-27 ENCOUNTER — Ambulatory Visit: Payer: 59 | Admitting: Internal Medicine

## 2013-11-02 ENCOUNTER — Ambulatory Visit: Payer: 59 | Admitting: Internal Medicine

## 2013-11-03 ENCOUNTER — Other Ambulatory Visit: Payer: Self-pay | Admitting: Cardiology

## 2013-12-01 ENCOUNTER — Encounter: Payer: Self-pay | Admitting: Cardiology

## 2013-12-01 ENCOUNTER — Ambulatory Visit (INDEPENDENT_AMBULATORY_CARE_PROVIDER_SITE_OTHER): Payer: 59 | Admitting: Cardiology

## 2013-12-01 VITALS — BP 110/60 | HR 69 | Wt 299.8 lb

## 2013-12-01 DIAGNOSIS — I471 Supraventricular tachycardia: Secondary | ICD-10-CM

## 2013-12-01 DIAGNOSIS — E785 Hyperlipidemia, unspecified: Secondary | ICD-10-CM

## 2013-12-01 DIAGNOSIS — I498 Other specified cardiac arrhythmias: Secondary | ICD-10-CM

## 2013-12-01 NOTE — Assessment & Plan Note (Signed)
No recent episodes. Continue Cardizem. Intolerant to beta blockers. If she has more frequent episodes in the future we'll consider referral to electrophysiology for ablation. 

## 2013-12-01 NOTE — Patient Instructions (Signed)
Your physician wants you to follow-up in: ONE YEAR WITH DR CRENSHAW You will receive a reminder letter in the mail two months in advance. If you don't receive a letter, please call our office to schedule the follow-up appointment.  

## 2013-12-01 NOTE — Assessment & Plan Note (Signed)
Management per primary care. 

## 2013-12-01 NOTE — Progress Notes (Signed)
      HPI: FU SVT. Seen in the emergency room September 29, 2010 with palpitations. Her electrocardiogram revealed SVT which broke with adenosine. Patient states she had an episode similar to this previously in [place]. Echocardiogram in April of 2013 showed normal LV function and grade 2 diastolic dysfunction. TSH normal. Patient has not tolerated beta blockers in past due to cough. Therefore placed on cardizem. I last saw her in May 2014. Since then, She denies dyspnea, chest pain or syncope. No recurrent bouts of SVT.   Current Outpatient Prescriptions  Medication Sig Dispense Refill  . aspirin 81 MG tablet Take 81 mg by mouth daily.      Marland Kitchen diltiazem (CARDIZEM CD) 120 MG 24 hr capsule TAKE ONE CAPSULE BY MOUTH ONCE DAILY  30 capsule  0  . furosemide (LASIX) 40 MG tablet Take 40 mg by mouth daily.       Marland Kitchen levothyroxine (SYNTHROID, LEVOTHROID) 100 MCG tablet Take 100 mcg by mouth daily before breakfast.      . spironolactone (ALDACTONE) 50 MG tablet One daily as needed swelling  30 tablet  11   No current facility-administered medications for this visit.     Past Medical History  Diagnosis Date  . Hypothyroidism   . Hyperlipidemia   . SVT (supraventricular tachycardia)     a. 10/2011 Echo: EF 60-65%, Gr 2 DD.  Marland Kitchen COPD (chronic obstructive pulmonary disease)   . Lower extremity edema   . Fatigue   . Joint ache   . Psoriasis     Past Surgical History  Procedure Laterality Date  . Lumbar disc surgery    . Ankle surgery    . Tonsillectomy      History   Social History  . Marital Status: Single    Spouse Name: N/A    Number of Children: 0  . Years of Education: N/A   Occupational History  .      Quality engineer   Social History Main Topics  . Smoking status: Former Smoker -- 1.00 packs/day for 16 years    Types: Cigarettes    Quit date: 07/13/1997  . Smokeless tobacco: Not on file  . Alcohol Use: 4.2 oz/week    7 Glasses of wine per week     Comment: 1 glass of wine  daily  . Drug Use: Not on file  . Sexual Activity: Not on file   Other Topics Concern  . Not on file   Social History Narrative  . No narrative on file    ROS: no fevers or chills, productive cough, hemoptysis, dysphasia, odynophagia, melena, hematochezia, dysuria, hematuria, rash, seizure activity, orthopnea, PND, pedal edema, claudication. Remaining systems are negative.  Physical Exam: Well-developed obese in no acute distress.  Skin is warm and dry.  HEENT is normal.  Neck is supple.  Chest is clear to auscultation with normal expansion.  Cardiovascular exam is regular rate and rhythm.  Abdominal exam nontender or distended. No masses palpated. Extremities show no edema. neuro grossly intact  ECG Sinus with nonspecific ST changes.

## 2013-12-06 ENCOUNTER — Other Ambulatory Visit: Payer: Self-pay | Admitting: Cardiology

## 2014-01-26 ENCOUNTER — Other Ambulatory Visit: Payer: Self-pay

## 2014-01-26 MED ORDER — DILTIAZEM HCL ER COATED BEADS 120 MG PO CP24: ORAL_CAPSULE | ORAL | Status: DC

## 2014-03-16 ENCOUNTER — Ambulatory Visit: Payer: Self-pay | Admitting: Gastroenterology

## 2014-03-20 LAB — PATHOLOGY REPORT

## 2014-09-05 ENCOUNTER — Other Ambulatory Visit: Payer: Self-pay | Admitting: Cardiology

## 2014-12-04 ENCOUNTER — Other Ambulatory Visit: Payer: Self-pay | Admitting: Cardiology

## 2015-02-22 ENCOUNTER — Other Ambulatory Visit: Payer: Self-pay | Admitting: Surgery

## 2015-02-22 DIAGNOSIS — Z6841 Body Mass Index (BMI) 40.0 and over, adult: Secondary | ICD-10-CM

## 2015-02-25 ENCOUNTER — Other Ambulatory Visit: Payer: Self-pay

## 2015-02-25 DIAGNOSIS — Z01818 Encounter for other preprocedural examination: Secondary | ICD-10-CM

## 2015-02-28 ENCOUNTER — Encounter: Payer: Self-pay | Admitting: Dietician

## 2015-02-28 ENCOUNTER — Encounter: Payer: 59 | Attending: Surgery | Admitting: Dietician

## 2015-02-28 DIAGNOSIS — Z6841 Body Mass Index (BMI) 40.0 and over, adult: Secondary | ICD-10-CM | POA: Diagnosis not present

## 2015-02-28 DIAGNOSIS — Z713 Dietary counseling and surveillance: Secondary | ICD-10-CM | POA: Insufficient documentation

## 2015-02-28 NOTE — Progress Notes (Signed)
  Pre-Op Assessment Visit:  Pre-Operative RYGB Surgery  Medical Nutrition Therapy:  Appt start time: 250   End time:  345.  Patient was seen on 02/28/2015 for Pre-Operative Nutrition Assessment. Assessment and letter of approval faxed to [facility] Surgery Bariatric Surgery Program coordinator on 02/28/2015.   Preferred Learning Style:   No preference indicated   Learning Readiness:   Ready  Handouts given during visit include:  Pre-Op Goals Bariatric Surgery Protein Shakes Fast Food Guide Pre op diet   During the appointment today the following Pre-Op Goals were reviewed with the patient: Maintain or lose weight as instructed by your surgeon Make healthy food choices Begin to limit portion sizes Limited concentrated sugars and fried foods Keep fat/sugar in the single digits per serving on   food labels Practice CHEWING your food  (aim for 30 chews per bite or until applesauce consistency) Practice not drinking 15 minutes before, during, and 30 minutes after each meal/snack Avoid all carbonated beverages  Avoid/limit caffeinated beverages  Avoid all sugar-sweetened beverages Consume 3 meals per day; eat every 3-5 hours Make a list of non-food related activities Aim for 64-100 ounces of FLUID daily  Aim for at least 60-80 grams of PROTEIN daily Look for a liquid protein source that contain ?15 g protein and ?5 g carbohydrate  (ex: shakes, drinks, shots)  Demonstrated degree of understanding via:  Teach Back  Teaching Method Utilized:  Visual Auditory Hands on  Barriers to learning/adherence to lifestyle change: none  Patient to call the [facility] to enroll in Pre-Op and Post-Op Nutrition Education when surgery date is scheduled.

## 2015-03-04 ENCOUNTER — Other Ambulatory Visit: Payer: Self-pay | Admitting: Cardiology

## 2015-03-07 NOTE — Progress Notes (Signed)
      HPI: FU SVT. Seen in the emergency room September 29, 2010 with palpitations. Her electrocardiogram revealed SVT which broke with adenosine. Patient states she had an episode similar to this previously in [place]. Echocardiogram in April of 2013 showed normal LV function and grade 2 diastolic dysfunction. TSH normal. Patient has not tolerated beta blockers in past due to cough. Therefore placed on cardizem. Since I last saw her, she has mild dyspnea on exertion. She has noticed some dyspnea when initially laying down but then resolves. Mild increased pedal edema. No chest pain, palpitations or syncope.  Current Outpatient Prescriptions  Medication Sig Dispense Refill  . Adalimumab (HUMIRA) 10 MG/0.2ML PSKT Inject into the skin.    Marland Kitchen aspirin 81 MG tablet Take 81 mg by mouth daily.    Marland Kitchen diltiazem (CARDIZEM CD) 120 MG 24 hr capsule Take 1 capsule by mouth  once daily 90 capsule 0  . furosemide (LASIX) 40 MG tablet Take 40 mg by mouth daily.     Marland Kitchen levothyroxine (SYNTHROID, LEVOTHROID) 100 MCG tablet Take 100 mcg by mouth daily before breakfast.    . omeprazole (PRILOSEC) 40 MG capsule Take 1 capsule by mouth 2 (two) times daily.    Marland Kitchen POTASSIUM PO Take 1 tablet by mouth daily.     No current facility-administered medications for this visit.     Past Medical History  Diagnosis Date  . Hypothyroidism   . Hyperlipidemia   . SVT (supraventricular tachycardia)     a. 10/2011 Echo: EF 60-65%, Gr 2 DD.  Marland Kitchen COPD (chronic obstructive pulmonary disease)   . Lower extremity edema   . Fatigue   . Joint ache   . Psoriasis     Past Surgical History  Procedure Laterality Date  . Lumbar disc surgery    . Ankle surgery    . Tonsillectomy      Social History   Social History  . Marital Status: Single    Spouse Name: N/A  . Number of Children: 0  . Years of Education: N/A   Occupational History  .      Quality engineer   Social History Main Topics  . Smoking status: Former Smoker --  1.00 packs/day for 16 years    Types: Cigarettes    Quit date: 07/13/1997  . Smokeless tobacco: Not on file  . Alcohol Use: 4.2 oz/week    7 Glasses of wine per week     Comment: 1 glass of wine daily  . Drug Use: Not on file  . Sexual Activity: Not on file   Other Topics Concern  . Not on file   Social History Narrative    ROS: no fevers or chills, productive cough, hemoptysis, dysphasia, odynophagia, melena, hematochezia, dysuria, hematuria, rash, seizure activity, orthopnea, PND, claudication. Remaining systems are negative.  Physical Exam: Well-developed obese in no acute distress.  Skin is warm and dry.  HEENT is normal.  Neck is supple.  Chest is clear to auscultation with normal expansion.  Cardiovascular exam is regular rate and rhythm.  Abdominal exam nontender or distended. No masses palpated. Extremities show trace edema. neuro grossly intact  ECG Sinus rhythm, nonspecific ST-T changes.

## 2015-03-08 ENCOUNTER — Encounter: Payer: Self-pay | Admitting: Cardiology

## 2015-03-08 ENCOUNTER — Encounter: Payer: Self-pay | Admitting: *Deleted

## 2015-03-08 ENCOUNTER — Ambulatory Visit (INDEPENDENT_AMBULATORY_CARE_PROVIDER_SITE_OTHER): Payer: 59 | Admitting: Cardiology

## 2015-03-08 VITALS — BP 128/82 | HR 72 | Ht 65.0 in | Wt 305.4 lb

## 2015-03-08 DIAGNOSIS — I471 Supraventricular tachycardia: Secondary | ICD-10-CM | POA: Diagnosis not present

## 2015-03-08 DIAGNOSIS — R0600 Dyspnea, unspecified: Secondary | ICD-10-CM

## 2015-03-08 DIAGNOSIS — E785 Hyperlipidemia, unspecified: Secondary | ICD-10-CM

## 2015-03-08 NOTE — Assessment & Plan Note (Signed)
Patient has some edema. She will continue Lasix and take additional 40 mg as needed. I asked her to keep her feet elevated.

## 2015-03-08 NOTE — Assessment & Plan Note (Signed)
She is considering gastric bypass surgery. If she proceeds she would require a stress test preoperatively. I have asked her to contact us and arrange this if the surgery is scheduled.

## 2015-03-08 NOTE — Assessment & Plan Note (Signed)
Probable component of obesity hypoventilation syndrome and sleep apnea. Continue present dose of Lasix. Repeat echocardiogram.

## 2015-03-08 NOTE — Patient Instructions (Signed)

## 2015-03-08 NOTE — Assessment & Plan Note (Signed)
Management per primary care. 

## 2015-03-08 NOTE — Assessment & Plan Note (Signed)
No recent episodes. Continue Cardizem. Intolerant to beta blockers. If she has more frequent episodes in the future we'll consider referral to electrophysiology for ablation.

## 2015-03-14 ENCOUNTER — Encounter: Payer: Self-pay | Admitting: Cardiology

## 2015-03-21 ENCOUNTER — Other Ambulatory Visit: Payer: Self-pay | Admitting: Cardiology

## 2015-03-21 DIAGNOSIS — I471 Supraventricular tachycardia: Secondary | ICD-10-CM

## 2015-03-21 DIAGNOSIS — R0600 Dyspnea, unspecified: Secondary | ICD-10-CM

## 2015-03-22 ENCOUNTER — Other Ambulatory Visit: Payer: Self-pay

## 2015-03-22 ENCOUNTER — Other Ambulatory Visit: Payer: Self-pay | Admitting: Cardiology

## 2015-03-22 ENCOUNTER — Ambulatory Visit (INDEPENDENT_AMBULATORY_CARE_PROVIDER_SITE_OTHER): Payer: 59

## 2015-03-22 DIAGNOSIS — I471 Supraventricular tachycardia: Secondary | ICD-10-CM

## 2015-03-22 DIAGNOSIS — R0600 Dyspnea, unspecified: Secondary | ICD-10-CM | POA: Diagnosis not present

## 2015-04-05 ENCOUNTER — Encounter: Payer: 59 | Attending: Surgery | Admitting: Dietician

## 2015-04-05 DIAGNOSIS — Z6841 Body Mass Index (BMI) 40.0 and over, adult: Secondary | ICD-10-CM | POA: Diagnosis not present

## 2015-04-05 DIAGNOSIS — Z713 Dietary counseling and surveillance: Secondary | ICD-10-CM | POA: Insufficient documentation

## 2015-04-05 NOTE — Patient Instructions (Addendum)
-  Keep working on reducing soda and increasing water -Plan to increase exercise  -Walk 2 miles 3 days a week

## 2015-04-05 NOTE — Progress Notes (Signed)
Supervised Weight Loss:  Appt start time: 0800 end time:  0815  SWL visit 1:  Primary concerns today: Cynthia Obrien returns for her 1st SWL visit in preparation for RYGB having gained 1 pound since last visit. She states she has been working on decreasing soda and drinking more water. Has been using sugar free water flavoring. Plans to increase exercise as the weather is getting cooler. Has an appointment next week with a psychologist to address stress eating.   Weight: 305.2 BMI: 50.9  Plan: -Keep working on reducing soda and increasing water -Plan to increase exercise  -Walk 2 miles 3 days a week   MEDICATIONS: see list  DIETARY INTAKE:  24-hr recall:  B ( AM): fast food   Snk ( AM): nuts or cheese and meat  L (PM): usually skips   Snk ( PM):  D (7-8 PM): baked fries and chicken OR beans and cornbread  Snk (PM): not usually  Beverages: diet soda, water  Recent physical activity: none   Estimated energy needs: 1400-1600 calories  Progress Towards Goal(s):  In progress.   Nutritional Diagnosis:  [place]-3.3 Overweight/obesity related to past poor dietary habits and physical inactivity as evidenced by patient in SWL for pending RYGB surgery following dietary guidelines for continued weight loss.     Intervention:  Nutrition counseling provided.   Monitoring/Evaluation:  Dietary intake, exercise, and body weight in 4 week(s).

## 2015-04-17 ENCOUNTER — Other Ambulatory Visit: Payer: Self-pay | Admitting: Cardiology

## 2015-04-29 ENCOUNTER — Encounter: Payer: Self-pay | Admitting: Dietician

## 2015-04-29 ENCOUNTER — Encounter: Payer: 59 | Attending: Surgery | Admitting: Dietician

## 2015-04-29 DIAGNOSIS — Z6841 Body Mass Index (BMI) 40.0 and over, adult: Secondary | ICD-10-CM | POA: Insufficient documentation

## 2015-04-29 DIAGNOSIS — Z713 Dietary counseling and surveillance: Secondary | ICD-10-CM | POA: Insufficient documentation

## 2015-04-29 NOTE — Progress Notes (Signed)
Supervised Weight Loss:  Appt start time: 0730 end time:  0745  SWL visit 2:  Primary concerns today: Cynthia Obrien returns for her 2nd SWL visit in preparation for RYGB having gained 3 pound since last visit. Feels like her hands and feet are swollen d/t Humira. Has cut back on stress eating and soda (down to 1 16 oz per day) and drinking more water. Is meeting with a psychologist to address stress eating.    Has not started working on chewing well or slowing down to eat. Has been trying to cut back on drinking during meals. Eating 2 meals a day though trying to have something small at lunch time. Having fast food breakfast. Walking about 1 x week. Walking more in general.    Weight: 308.6 BMI: 51.4  Plan: -Keep working on reducing soda and increasing water -Plan to increase exercise  -Walk 2 miles 3 days a week  -Try putting your fork down between bites and counting 20-30 chews per bite -Start working on not drinking during meals -Try protein shakes (think about using at breakfast or lunch) -Think about adding a multivitamin and vitamin D  MEDICATIONS: see list  DIETARY INTAKE:  24-hr recall:  B ( AM): fast food   Snk ( AM): nuts or cheese and meat  L (PM): usually skips   Snk ( PM):  D (7-8 PM): baked fries and chicken OR beans and cornbread  Snk (PM): not usually  Beverages: diet soda, water  Recent physical activity: none   Estimated energy needs: 1400-1600 calories  Progress Towards Goal(s):  In progress.   Nutritional Diagnosis:  [place]-3.3 Overweight/obesity related to past poor dietary habits and physical inactivity as evidenced by patient in SWL for pending RYGB surgery following dietary guidelines for continued weight loss.     Intervention:  Nutrition counseling provided.   Monitoring/Evaluation:  Dietary intake, exercise, and body weight in 4 week(s).

## 2015-04-29 NOTE — Patient Instructions (Addendum)
-  Keep working on reducing soda and increasing water -Plan to increase exercise  -Walk 2 miles 3 days a week  -Try putting your fork down between bites and counting 20-30 chews per bite -Start working on not drinking during meals -Try protein shakes (think about using at breakfast or lunch) -Think about adding a multivitamin and vitamin D

## 2015-05-23 ENCOUNTER — Encounter: Payer: Self-pay | Admitting: Dietician

## 2015-05-23 ENCOUNTER — Encounter: Payer: 59 | Attending: Surgery | Admitting: Dietician

## 2015-05-23 DIAGNOSIS — Z713 Dietary counseling and surveillance: Secondary | ICD-10-CM | POA: Diagnosis not present

## 2015-05-23 DIAGNOSIS — Z6841 Body Mass Index (BMI) 40.0 and over, adult: Secondary | ICD-10-CM | POA: Diagnosis not present

## 2015-05-23 NOTE — Patient Instructions (Addendum)
-  Keep working on reducing soda and increasing water -Plan to increase exercise  -Walk 2 miles 3 days a week  -Try putting your fork down between bites and counting 20-30 chews per bite -Start working on not drinking during meals -Try Quest protein chips or cheese crisp if you need something crunchy

## 2015-05-23 NOTE — Progress Notes (Signed)
Supervised Weight Loss:  Appt start time: [id number] end time:  0810  SWL visit 3:  Primary concerns today: Cynthia Obrien returns for her 3rd SWL visit in preparation for RYGB having gained 1 pound since last visit. Is down to 1 soda per day and has cut out alcohol. Drinking 32 oz water each day. Has had not chips in the past 3 weeks. Has increased Lasix which is helping with swelling. Meeting with a psychologist to address stress eating.  Starting taking multivitamins and calcium + Vitamin D.   Started working on chewing well and still needs to work on slowing down and sitting down to eat. Needs to work on cutting back on drinking during meals. Eating 3  meals a day and having some small snacks. Having an Unjury protein shake for breakfast. Not grazing like she was.  Walking about 1 x week. Getting in more steps each day.   Weight: 309.3 BMI: 51.5  Plan: -Keep working on reducing soda and increasing water -Plan to increase exercise  -Walk 2 miles 3 days a week  -Try putting your fork down between bites and counting 20-30 chews per bite -Start working on not drinking during meals -Try Quest protein chips or cheese crisp if you need something crunchy  MEDICATIONS: see list  DIETARY INTAKE:  24-hr recall:  B ( AM): Unjury Snk ( AM): nuts or cheese and meat  L (PM): usually skips   Snk ( PM):  D (7-8 PM): baked fries and chicken OR beans and cornbread  Snk (PM): not usually  Beverages: diet soda, water  Recent physical activity: none   Estimated energy needs: 1400-1600 calories  Progress Towards Goal(s):  In progress.   Nutritional Diagnosis:  [place]-3.3 Overweight/obesity related to past poor dietary habits and physical inactivity as evidenced by patient in SWL for pending RYGB surgery following dietary guidelines for continued weight loss.     Intervention:  Nutrition counseling provided.   Monitoring/Evaluation:  Dietary intake, exercise, and body weight in 4 week(s).

## 2015-05-24 ENCOUNTER — Other Ambulatory Visit (HOSPITAL_COMMUNITY): Payer: 59

## 2015-06-20 ENCOUNTER — Encounter: Payer: 59 | Attending: Surgery | Admitting: Dietician

## 2015-06-20 DIAGNOSIS — Z6841 Body Mass Index (BMI) 40.0 and over, adult: Secondary | ICD-10-CM | POA: Insufficient documentation

## 2015-06-20 DIAGNOSIS — Z713 Dietary counseling and surveillance: Secondary | ICD-10-CM | POA: Diagnosis not present

## 2015-06-20 NOTE — Progress Notes (Signed)
Supervised Weight Loss:  Appt start time: 0805 end time:  0820  SWL visit 4:  Primary concerns today: Cynthia Obrien returns for her 4th SWL visit in preparation for RYGB having lost 6 pounds since last visit. She has eliminated sodas and alcohol. She is still having a protein shake for breakfast. She has stopped having fast food and chips. Eating 6 small meals/snacks per day. She is making this a habit to prepare for surgery. She has been working on drinking more water. She reports that she is excited and scared about surgery.    Weight: 309.3 BMI: 50.5  Plan: -Keep working on reducing soda and increasing water -Plan to increase exercise  -Walk 2 miles 3 days a week  -Try putting your fork down between bites and counting 20-30 chews per bite -Start working on not drinking during meals -Try Quest protein chips or cheese crisp if you need something crunchy  MEDICATIONS: see list  DIETARY INTAKE:  6 small meals/snacks throughout the day  Beverages: diet soda, water  Recent physical activity: none   Estimated energy needs: 1400-1600 calories  Progress Towards Goal(s):  In progress.   Nutritional Diagnosis:  Cynthia Obrien Overweight/obesity related to past poor dietary habits and physical inactivity as evidenced by patient in SWL for pending RYGB surgery following dietary guidelines for continued weight loss.     Intervention:  Nutrition counseling provided.   Monitoring/Evaluation:  Dietary intake, exercise, and body weight in 4 week(s).

## 2015-06-20 NOTE — Patient Instructions (Addendum)
-  Keep working on reducing soda and increasing water -Plan to increase exercise  -Walk 2 miles 3 days a week  -Try putting your fork down between bites and counting 20-30 chews per bite -Start working on not drinking during meals -Try Quest protein chips or cheese crisp if you need something crunchy  [email or web address]

## 2015-06-21 ENCOUNTER — Encounter: Payer: Self-pay | Admitting: Dietician

## 2015-07-22 ENCOUNTER — Ambulatory Visit: Payer: 59 | Admitting: Dietician

## 2015-07-22 ENCOUNTER — Encounter: Payer: 59 | Attending: Surgery | Admitting: Skilled Nursing Facility1

## 2015-07-22 ENCOUNTER — Encounter: Payer: Self-pay | Admitting: Skilled Nursing Facility1

## 2015-07-22 DIAGNOSIS — Z713 Dietary counseling and surveillance: Secondary | ICD-10-CM | POA: Diagnosis not present

## 2015-07-22 DIAGNOSIS — Z6841 Body Mass Index (BMI) 40.0 and over, adult: Secondary | ICD-10-CM | POA: Diagnosis not present

## 2015-07-22 NOTE — Progress Notes (Signed)
Supervised Weight Loss:  Appt start time: 1200 end time:  1215  SWL visit 5:  Primary concerns today: Cynthia Obrien returns for her 5th SWL visit in preparation for RYGB having lost 8 pounds since last visit.  Pt states she has not eaten any fast food, soda, or alcohol.  Pt states she is drinking at least 64 ounces of water and using sugar free flavorings at least. Pt states she is still practicing 6 small meals and chewing but does not think it'll be a problem. Pt states she started using baby spoons to help with chewing and states the food practically evaporates. Pt states she eats soup for lunch and peanuts throughout the day. Pt states she preps for breakfast so she will not eat fast food. Pt states she has not tried the protein chips but is using peanuts for those salty/chrucnhy cravings. Pt states she has not decreased CHO intake-baked french fries and bread.   Weight: 301  BMI: 50  Plan: -Keep working on reducing soda and increasing water -Plan to increase exercise  -Walk 2 miles 3 days a week  -Work on not drinking during meals -Be aware of your carbohydrate intake throughout the day; have once slice instead of two for bread, focus on having smaller portions of carbohydrates and not buying french fries  MEDICATIONS: see list  DIETARY INTAKE:  6 small meals/snacks throughout the day  Beverages:  water  Recent physical activity: walking 3 times a week and working on increasing over time also complains of pins and needle sin her feet and states this is due to her thyroid problems   Estimated energy needs: 1400-1600 calories  Progress Towards Goal(s):  In progress.   Nutritional Diagnosis:  [place]-3.3 Overweight/obesity related to past poor dietary habits and physical inactivity as evidenced by patient in SWL for pending RYGB surgery following dietary guidelines for continued weight loss.     Intervention:  Nutrition counseling provided.   Monitoring/Evaluation:  Dietary intake,  exercise, and body weight in 4 week(s).

## 2015-07-24 ENCOUNTER — Ambulatory Visit (HOSPITAL_COMMUNITY): Payer: 59

## 2015-07-24 ENCOUNTER — Ambulatory Visit (HOSPITAL_COMMUNITY): Admission: RE | Admit: 2015-07-24 | Payer: 59 | Source: Ambulatory Visit

## 2015-07-24 ENCOUNTER — Encounter (HOSPITAL_COMMUNITY): Admission: RE | Admit: 2015-07-24 | Payer: 59 | Source: Ambulatory Visit

## 2015-08-05 ENCOUNTER — Ambulatory Visit (HOSPITAL_COMMUNITY)
Admission: RE | Admit: 2015-08-05 | Discharge: 2015-08-05 | Disposition: A | Discharge status: Home / Self Care | Payer: 59 | Source: Ambulatory Visit | Attending: Surgery | Admitting: Surgery

## 2015-08-05 ENCOUNTER — Other Ambulatory Visit: Payer: Self-pay

## 2015-08-05 DIAGNOSIS — K219 Gastro-esophageal reflux disease without esophagitis: Secondary | ICD-10-CM | POA: Diagnosis not present

## 2015-08-05 DIAGNOSIS — E785 Hyperlipidemia, unspecified: Secondary | ICD-10-CM | POA: Diagnosis not present

## 2015-08-05 DIAGNOSIS — Z6841 Body Mass Index (BMI) 40.0 and over, adult: Secondary | ICD-10-CM | POA: Diagnosis not present

## 2015-08-05 DIAGNOSIS — Z01818 Encounter for other preprocedural examination: Secondary | ICD-10-CM | POA: Insufficient documentation

## 2015-08-05 DIAGNOSIS — K224 Dyskinesia of esophagus: Secondary | ICD-10-CM | POA: Diagnosis not present

## 2015-08-14 ENCOUNTER — Encounter: Payer: Self-pay | Admitting: Cardiology

## 2015-08-14 ENCOUNTER — Encounter: Payer: Self-pay | Admitting: Dietician

## 2015-08-14 ENCOUNTER — Encounter: Payer: 59 | Attending: Surgery | Admitting: Dietician

## 2015-08-14 DIAGNOSIS — Z713 Dietary counseling and surveillance: Secondary | ICD-10-CM | POA: Diagnosis not present

## 2015-08-14 DIAGNOSIS — Z6841 Body Mass Index (BMI) 40.0 and over, adult: Secondary | ICD-10-CM | POA: Insufficient documentation

## 2015-08-14 NOTE — Progress Notes (Signed)
Supervised Weight Loss:  Appt start time: 800 end time:  815  SWL visit 6:  Primary concerns today: Cynthia Obrien returns for her 6th SWL visit in preparation for RYGB having lost 3 pounds since last visit. She continues to drink 1-2 protein shakes per day in preparation for the post op diet. She is feeling nervous but ready for surgery. Feels like the easiest part will be meeting her fluid needs because she is already in the habit of drinking plenty of water. Her mom is supportive and plans to stay with her as she recovers from surgery. She also has EAP benefits through her job and has been working on stress coping mechanisms.    Weight: 298.7 lbs  BMI: 49.8  Plan: -Keep working on reducing soda and increasing water -Plan to increase exercise  -Walk 2 miles 3 days a week  -Work on not drinking during meals -Be aware of your carbohydrate intake throughout the day; have once slice instead of two for bread, focus on having smaller portions of carbohydrates and not buying french fries  MEDICATIONS: see list  DIETARY INTAKE:  6 small meals/snacks throughout the day  Beverages:  water  Recent physical activity: walking 3 times a week and working on increasing over time also complains of pins and needle sin her feet and states this is due to her thyroid problems   Estimated energy needs: 1400-1600 calories  Progress Towards Goal(s):  In progress.   Nutritional Diagnosis:  [place]-3.3 Overweight/obesity related to past poor dietary habits and physical inactivity as evidenced by patient in SWL for pending RYGB surgery following dietary guidelines for continued weight loss.     Intervention:  Nutrition counseling provided.   Monitoring/Evaluation:  Dietary intake, exercise, and body weight in 4 week(s).

## 2015-08-14 NOTE — Patient Instructions (Signed)
"  Complete" Multivitamin: Sleeve Gastrectomy and RYGB patients take a double dose of MVI. Vitamin must be liquid or chewable but not gummy. Examples of these include Flintstones Complete and Centrum Complete. If the vitamin is bariatric-specific, take 1 dose as it is already formulated for bariatric surgery patients. Examples of these are Bariatric Advantage, Celebrate, and [employer]. These can be found at the [facility] and/or online.      Calcium citrate: 1500 mg/day of Calcium citrate (also chewable or liquid) is recommended for all procedures. The body is only able to absorb 500-600 mg of Calcium at one time so 3 daily doses of 500 mg are recommended. Calcium doses must be taken a minimum of 2 hours apart. Additionally, Calcium must be taken 2 hours apart from iron-containing MVI. Examples of brands include Celebrate, Bariatric Advantage, and Wellesse. These brands must be purchased online or at the [facility]. Citracal Petites is the only Calcium citrate supplement found in general grocery stores and pharmacies. This is in tablet form and may be recommended for patients who do not tolerate chewable Calcium.  Continued or added Vitamin D supplementation based on individual needs.     Vitamin B12: 300-500 mcg/day for Sleeve Gastrectomy and RYGB. Must be taken intramuscularly, sublingually, or inhaled nasally. Oral route is not recommended.

## 2015-08-16 ENCOUNTER — Other Ambulatory Visit: Payer: Self-pay | Admitting: *Deleted

## 2015-08-16 DIAGNOSIS — R0600 Dyspnea, unspecified: Secondary | ICD-10-CM

## 2015-08-16 DIAGNOSIS — Z01818 Encounter for other preprocedural examination: Secondary | ICD-10-CM

## 2015-08-27 ENCOUNTER — Telehealth (HOSPITAL_COMMUNITY): Payer: Self-pay

## 2015-08-27 NOTE — Telephone Encounter (Signed)
Encounter complete. 

## 2015-08-27 NOTE — Progress Notes (Signed)
  Pre-Operative Nutrition Class:  Appt start time: 9021   End time:  1830.  Patient was seen on 08/26/2015 for Pre-Operative Bariatric Surgery Education at [facility].   Surgery date:  Surgery type: RYGB Start weight at [facility]: 304 lbs on 02/28/2016 Weight today: 290.5 lbs  TANITA  BODY COMP RESULTS  08/26/15   BMI (kg/m^2) 48.3   Fat Mass (lbs) 151   Fat Free Mass (lbs) 139.5   Total Body Water (lbs) 102   Samples given per MNT protocol. Patient educated on appropriate usage: Celebrate Multivitamin (black cherry - qty 1) Lot #: [id number] Exp: 02/2017  Celebrate Calcium Citrate chew (chocolate - qty 1) Lot #: [id number] Exp: 09/2016  Renee Pain (vanilla - qty 1) Lot #: [id number] Exp: 03/2016  PB2 (qty 1) Lot #: [id number] Exp: 04/2017  Premier Protein shake (chocolate - qty 1) Lot #: [id number] Exp: 04/2016  The following the learning objectives were met by the patient during this course:  Identify Pre-Op Dietary Goals and will begin 2 weeks pre-operatively  Identify appropriate sources of fluids and proteins   State protein recommendations and appropriate sources pre and post-operatively  Identify Post-Operative Dietary Goals and will follow for 2 weeks post-operatively  Identify appropriate multivitamin and calcium sources  Describe the need for physical activity post-operatively and will follow MD recommendations  State when to call healthcare provider regarding medication questions or post-operative complications  Handouts given during class include:  Pre-Op Bariatric Surgery Diet Handout  Protein Shake Handout  Post-Op Bariatric Surgery Nutrition Handout  BELT Program Information Flyer  Support Group Information Flyer  [facility] Bariatric Supplements Price List  Follow-Up Plan: Patient will follow-up at [facility] 2 weeks post operatively for diet advancement per MD.

## 2015-08-29 ENCOUNTER — Ambulatory Visit (HOSPITAL_COMMUNITY)
Admission: RE | Admit: 2015-08-29 | Discharge: 2015-08-29 | Disposition: A | Discharge status: Home / Self Care | Payer: 59 | Source: Ambulatory Visit | Attending: Cardiology | Admitting: Cardiology

## 2015-08-29 DIAGNOSIS — R0600 Dyspnea, unspecified: Secondary | ICD-10-CM | POA: Diagnosis not present

## 2015-08-29 DIAGNOSIS — R5383 Other fatigue: Secondary | ICD-10-CM | POA: Diagnosis not present

## 2015-08-29 DIAGNOSIS — R0609 Other forms of dyspnea: Secondary | ICD-10-CM | POA: Diagnosis not present

## 2015-08-29 DIAGNOSIS — Z01818 Encounter for other preprocedural examination: Secondary | ICD-10-CM | POA: Diagnosis not present

## 2015-08-29 DIAGNOSIS — Z6841 Body Mass Index (BMI) 40.0 and over, adult: Secondary | ICD-10-CM | POA: Diagnosis not present

## 2015-08-29 DIAGNOSIS — E669 Obesity, unspecified: Secondary | ICD-10-CM | POA: Insufficient documentation

## 2015-08-29 DIAGNOSIS — Z87891 Personal history of nicotine dependence: Secondary | ICD-10-CM | POA: Diagnosis not present

## 2015-08-29 DIAGNOSIS — Z8249 Family history of ischemic heart disease and other diseases of the circulatory system: Secondary | ICD-10-CM | POA: Insufficient documentation

## 2015-08-29 MED ORDER — TECHNETIUM TC 99M SESTAMIBI GENERIC - CARDIOLITE
30.5000 | Freq: Once | INTRAVENOUS | Status: AC | PRN
  Administered 2015-08-29: 31 via INTRAVENOUS

## 2015-08-29 MED ORDER — REGADENOSON 0.4 MG/5ML IV SOLN
0.4000 mg | Freq: Once | INTRAVENOUS | Status: AC
  Administered 2015-08-29: 0.4 mg via INTRAVENOUS

## 2015-08-30 ENCOUNTER — Ambulatory Visit (HOSPITAL_COMMUNITY)
Admission: RE | Admit: 2015-08-30 | Discharge: 2015-08-30 | Disposition: A | Discharge status: Home / Self Care | Payer: 59 | Source: Ambulatory Visit | Attending: Cardiology | Admitting: Cardiology

## 2015-08-30 LAB — MYOCARDIAL PERFUSION IMAGING
CHL CUP NUCLEAR SDS: 6
CHL CUP NUCLEAR SRS: 3
CHL CUP NUCLEAR SSS: 9
CHL CUP RESTING HR STRESS: 74 {beats}/min
LV sys vol: 30 mL
LVDIAVOL: 83 mL
Peak HR: 99 {beats}/min
TID: 0.89

## 2015-08-30 MED ORDER — TECHNETIUM TC 99M SESTAMIBI GENERIC - CARDIOLITE
29.1000 | Freq: Once | INTRAVENOUS | Status: AC | PRN
  Administered 2015-08-30: 29.1 via INTRAVENOUS

## 2015-12-26 ENCOUNTER — Other Ambulatory Visit: Payer: Self-pay | Admitting: Cardiology

## 2015-12-26 NOTE — Telephone Encounter (Signed)
Rx(s) sent to pharmacy electronically.  

## 2016-03-26 ENCOUNTER — Encounter: Payer: Self-pay | Admitting: Dietician

## 2016-03-26 ENCOUNTER — Encounter: Payer: 59 | Attending: Internal Medicine | Admitting: Dietician

## 2016-03-26 DIAGNOSIS — E669 Obesity, unspecified: Secondary | ICD-10-CM | POA: Insufficient documentation

## 2016-03-26 DIAGNOSIS — Z713 Dietary counseling and surveillance: Secondary | ICD-10-CM | POA: Diagnosis present

## 2016-03-26 NOTE — Progress Notes (Signed)
  Medical Nutrition Therapy:  Appt start time: 0805 end time:  0900.   Assessment:  Primary concerns today: Cynthia Obrien is here today to discuss her weight. She was pursuing bariatric surgery last year and found out her insurance would not cover the procedure. She reports she is stuck at her current weight of 270 lbs (30 lbs lost in the last 7 months). Cynthia Obrien states that she has lost 30 lbs in the past and this is usually when she plateaus and gets frustrated. Having a lot of trouble with plantar fasciitis and unable to be as active as she would like. Currently wearing a boot and has been receiving Cortisone shots regularly. Continues to take multivitamin and Calcium supplement and replaces up to 2 meals per day with protein shake. Has continued to avoid fast food.   Preferred Learning Style:  No preference indicated   Learning Readiness:   Ready   MEDICATIONS: see list   DIETARY INTAKE:  Usual eating pattern includes 3 meals and 2 snacks per day. Avoided foods include salad.   Likes green beans, Brussels sprouts, asparagus, cauliflower  24-hr recall:  B ( AM): Unjury or Premier protein shake  Snk ( AM): string cheese and ham  L ( PM): Protein shake, sometimes a KIND bar Snk ( PM): usually none, sometimes popcorn D ( PM): hamburger or deli meat and cheese in a tortilla wrap OR chicken and vegetables Snk ( PM): sugar free pudding cup  Beverages: at least 64 oz water or Crystal Light  Usual physical activity: Water aerobics and aquatic weight training  Estimated energy needs: 1400-1600 calories  Progress Towards Goal(s):  In progress.   Nutritional Diagnosis:  [place]-3.3 Overweight/obesity As related to history of excessive energy intake and inappropriate food choices.  As evidenced by BMI 44.9.    Intervention:  Nutrition counseling provided. -Increase/maintain physical activity as tolerated and per physician recommendation -Prep some low carb egg cups on Sundays to have for  breakfast for the week   -Or another high-protein breakfast: Boiled egg, sausage, almonds and cheese stick -Keep your protein shake for lunch -Continue to try to include veggies -Occasionally keep a food log and forgive yourself if you don't do it one day    Habits to keep: -Finding ways to be motivated to exercise -Controlling your environment - avoiding trigger foods -Avoiding fast food -Drinking plenty of water -80/20 rule -Making an effort to include veggies -Positive attitude!! -Being conscious and self-aware -Asking for help when you need it -Planning your "play" meals -Working on emotional eating (anger)  Teaching Method Utilized:  Visual Auditory Hands on  Handouts given during visit include:  MyPlate  Barriers to learning/adherence to lifestyle change: plantar fasciitis  Demonstrated degree of understanding via:  Teach Back   Monitoring/Evaluation:  Dietary intake, exercise, and body weight in 6 week(s).

## 2016-03-26 NOTE — Patient Instructions (Addendum)
-  Increase/maintain physical activity as tolerated and per physician recommendation  -Prep some low carb egg cups on Sundays to have for breakfast for the week   -Or another high-protein breakfast: Boiled egg, sausage, almonds and cheese stick  -Keep your protein shake for lunch  -Continue to try to include veggies  -Occasionally keep a food log and forgive yourself if you don't do it one day    Habits to keep: -Finding ways to be motivated to exercise -Controlling your environment - avoiding trigger foods -Avoiding fast food -Drinking plenty of water -80/20 rule -Making an effort to include veggies -Positive attitude!! -Being conscious and self-aware -Asking for help when you need it -Planning your "play" meals -Working on emotional eating (anger)

## 2016-05-20 ENCOUNTER — Ambulatory Visit: Payer: 59 | Admitting: Dietician

## 2016-06-12 ENCOUNTER — Encounter: Payer: 59 | Attending: Internal Medicine | Admitting: Dietician

## 2016-06-12 ENCOUNTER — Encounter: Payer: Self-pay | Admitting: Dietician

## 2016-06-12 DIAGNOSIS — E669 Obesity, unspecified: Secondary | ICD-10-CM | POA: Insufficient documentation

## 2016-06-12 DIAGNOSIS — Z713 Dietary counseling and surveillance: Secondary | ICD-10-CM | POA: Diagnosis present

## 2016-06-12 NOTE — Progress Notes (Signed)
  Medical Nutrition Therapy:  Appt start time: 0825 end time: 835   Follow up:  Primary concerns today: Cynthia Obrien returns having maintained her weight despite Thanksgiving. Has been doing a weight training class at the [facility] 2x a week. Notices that her clothes are fitting differently. Has continued to avoid fast food and have a protein shake for lunch. Has continued to avoid trigger foods and control her environment.   Preferred Learning Style:  No preference indicated   Learning Readiness:   Ready   MEDICATIONS: see list   DIETARY INTAKE:  Usual eating pattern includes 3 meals and 2 snacks per day. Avoided foods include salad.   Likes green beans, Brussels sprouts, asparagus, cauliflower  24-hr recall:  B ( AM): Unjury or Premier protein shake  Snk ( AM): string cheese and ham  L ( PM): Protein shake, sometimes a KIND bar Snk ( PM): usually none, sometimes popcorn D ( PM): hamburger or deli meat and cheese in a tortilla wrap OR chicken and vegetables Snk ( PM): sugar free pudding cup  Beverages: at least 64 oz water or Crystal Light  Usual physical activity: Water aerobics and aquatic weight training  Estimated energy needs: 1400-1600 calories  Progress Towards Goal(s):  In progress.   Nutritional Diagnosis:  Cynthia Obrien-3.3 Overweight/obesity As related to history of excessive energy intake and inappropriate food choices.  As evidenced by BMI 44.9.    Intervention:  Nutrition counseling provided. -Increase/maintain physical activity as tolerated and per physician recommendation -Prep some low carb egg cups on Sundays to have for breakfast for the week   -Or another high-protein breakfast: Boiled egg, sausage, almonds and cheese stick -Keep your protein shake for lunch -Continue to try to include veggies -Occasionally keep a food log and forgive yourself if you don't do it one day    Habits to keep: -Finding ways to be motivated to exercise -Controlling your environment -  avoiding trigger foods -Avoiding fast food -Drinking plenty of water -80/20 rule -Making an effort to include veggies -Positive attitude!! -Being conscious and self-aware -Asking for help when you need it -Planning your "play" meals -Working on emotional eating (anger)  Teaching Method Utilized:  Visual Auditory Hands on  Handouts given during visit include:  MyPlate  Barriers to learning/adherence to lifestyle change: plantar fasciitis  Demonstrated degree of understanding via:  Teach Back   Monitoring/Evaluation:  Dietary intake, exercise, and body weight in 6 week(s).

## 2016-06-12 NOTE — Patient Instructions (Addendum)
-  Increase/maintain physical activity as tolerated and per physician recommendation  -Prep some low carb egg cups on Sundays to have for breakfast for the week   -Or another high-protein breakfast: Boiled egg, sausage, almonds and cheese stick  -Keep your protein shake for lunch  -Continue to try to include veggies  -Occasionally keep a food log and forgive yourself if you don't do it one day    Habits to keep: -Finding ways to be motivated to exercise -Controlling your environment - avoiding trigger foods -Avoiding fast food -Drinking plenty of water -80/20 rule -Making an effort to include veggies -Positive attitude!! -Being conscious and self-aware -Asking for help when you need it -Planning your "play" meals -Working on emotional eating (anger)

## 2016-07-22 ENCOUNTER — Other Ambulatory Visit: Payer: Self-pay | Admitting: Cardiology

## 2016-07-22 NOTE — Telephone Encounter (Signed)
Rx(s) sent to pharmacy electronically.  

## 2016-08-14 NOTE — Progress Notes (Signed)
HPI: FU SVT. Seen in the emergency room September 29, 2010 with palpitations. Her electrocardiogram revealed SVT which broke with adenosine. Patient states she had an episode similar to this previously in [place]. Echocardiogram in April of 2013 showed normal LV function and grade 2 diastolic dysfunction. TSH normal. Patient has not tolerated beta blockers in past due to cough. Therefore placed on cardizem. Echocardiogram September 2016 showed normal LV systolic function. Nuclear study February 2017 showed ejection fraction 64% and normal perfusion. Since I last saw her, she has occasional skips which have increased but no sustained palpitations. She denies dyspnea, chest pain or syncope.  Current Outpatient Prescriptions  Medication Sig Dispense Refill  . aspirin 81 MG tablet Take 81 mg by mouth daily.    . calcium-vitamin D 250-100 MG-UNIT tablet Take 1 tablet by mouth 2 (two) times daily.    . COSENTYX SENSOREADY 300 DOSE 150 MG/ML SOAJ Inject 1 each as directed every 30 (thirty) days.    Marland Kitchen diltiazem (CARTIA XT) 120 MG 24 hr capsule Take 1 capsule (120 mg total) by mouth daily. <PLEASE MAKE APPOINTMENT FOR REFILLS> 30 capsule 0  . furosemide (LASIX) 40 MG tablet Take 80 mg by mouth daily.     Marland Kitchen levothyroxine (SYNTHROID, LEVOTHROID) 112 MCG tablet Take 1 tablet by mouth daily.    . Misc Natural Products (GLUCOSAMINE CHONDROITIN ADV PO) Take 1 tablet by mouth 2 (two) times daily.    . Multiple Vitamin (MULTIVITAMIN) tablet Take 1 tablet by mouth daily.    Marland Kitchen omeprazole (PRILOSEC) 40 MG capsule Take 1 capsule by mouth 2 (two) times daily.    Marland Kitchen POTASSIUM PO Take 1 tablet by mouth daily.     No current facility-administered medications for this visit.      Past Medical History:  Diagnosis Date  . COPD (chronic obstructive pulmonary disease) ([place])   . Fatigue   . Hyperlipidemia   . Hypothyroidism   . Joint ache   . Lower extremity edema   . Psoriasis   . SVT (supraventricular  tachycardia) ([place])    a. 10/2011 Echo: EF 60-65%, Gr 2 DD.    Past Surgical History:  Procedure Laterality Date  . ANKLE SURGERY    . LUMBAR DISC SURGERY    . tonsillectomy      Social History   Social History  . Marital status: Single    Spouse name: N/A  . Number of children: 0  . Years of education: N/A   Occupational History  .      Quality engineer   Social History Main Topics  . Smoking status: Former Smoker    Packs/day: 1.00    Years: 16.00    Types: Cigarettes    Quit date: 07/13/1997  . Smokeless tobacco: Never Used  . Alcohol use 4.2 oz/week    7 Glasses of wine per week     Comment: 1 glass of wine daily  . Drug use: Unknown  . Sexual activity: Not on file   Other Topics Concern  . Not on file   Social History Narrative  . No narrative on file    Family History  Problem Relation Age of Onset  . Heart disease Mother     MV repair; ICD  . Hypertension Mother   . Kidney disease Mother   . Skin cancer Maternal Grandmother   . Stroke Father   . Diabetes Father   . Hypertension Father   . Diabetes Sister  ROS: no fevers or chills, productive cough, hemoptysis, dysphasia, odynophagia, melena, hematochezia, dysuria, hematuria, rash, seizure activity, orthopnea, PND, pedal edema, claudication. Remaining systems are negative.  Physical Exam: Well-developed obese in no acute distress.  Skin is warm and dry.  HEENT is normal.  Neck is supple.  Chest is clear to auscultation with normal expansion.  Cardiovascular exam is regular rate and rhythm.  Abdominal exam nontender or distended. No masses palpated. Extremities show trace edema. neuro grossly intact  ECG-Sinus rhythm at a rate of 63. Nonspecific ST changes.  A/P  1 Supraventricular tachycardia-patient is doing well with medical therapy. No recent episodes. Continue Cardizem. If she has more frequent episodes in the future we can consider referral to electrophysiology for ablation. Note she  has occasional skips but no sustained disturbances. If her palpitations worsen we could consider adding Inderal which she tolerated in the past but did not tolerate other beta blockers.  2 hyperlipidemia-management per primary care.  3 edema-continue present dose of Lasix.  4 Obesity-We discussed weight loss.  Kirk Ruths, MD

## 2016-08-21 ENCOUNTER — Ambulatory Visit (INDEPENDENT_AMBULATORY_CARE_PROVIDER_SITE_OTHER): Payer: 59 | Admitting: Cardiology

## 2016-08-21 ENCOUNTER — Encounter: Payer: Self-pay | Admitting: Cardiology

## 2016-08-21 ENCOUNTER — Other Ambulatory Visit: Payer: Self-pay | Admitting: *Deleted

## 2016-08-21 VITALS — BP 100/80 | HR 62 | Ht 65.0 in | Wt 279.0 lb

## 2016-08-21 DIAGNOSIS — I471 Supraventricular tachycardia: Secondary | ICD-10-CM

## 2016-08-21 DIAGNOSIS — E7800 Pure hypercholesterolemia, unspecified: Secondary | ICD-10-CM | POA: Diagnosis not present

## 2016-08-21 MED ORDER — DILTIAZEM HCL ER COATED BEADS 120 MG PO CP24: 120.0000 mg | ORAL_CAPSULE | Freq: Every day | ORAL | 3 refills | Status: DC

## 2016-08-21 NOTE — Patient Instructions (Signed)
Your physician wants you to follow-up in: ONE YEAR WITH DR CRENSHAW You will receive a reminder letter in the mail two months in advance. If you don't receive a letter, please call our office to schedule the follow-up appointment.   If you need a refill on your cardiac medications before your next appointment, please call your pharmacy.  

## 2016-09-07 ENCOUNTER — Ambulatory Visit: Payer: 59 | Admitting: Registered"

## 2017-03-29 ENCOUNTER — Encounter: Payer: BLUE CROSS/BLUE SHIELD | Attending: Surgery | Admitting: Registered"

## 2017-03-29 DIAGNOSIS — E669 Obesity, unspecified: Secondary | ICD-10-CM

## 2017-03-29 DIAGNOSIS — Z713 Dietary counseling and surveillance: Secondary | ICD-10-CM | POA: Diagnosis present

## 2017-03-29 DIAGNOSIS — Z6841 Body Mass Index (BMI) 40.0 and over, adult: Secondary | ICD-10-CM | POA: Insufficient documentation

## 2017-03-29 NOTE — Progress Notes (Signed)
  Pre-Operative Nutrition Class:  Appt start time: 8:15  End time:  9:15  Patient was seen on 03/29/2017 for Pre-Operative Bariatric Surgery Education at [facility].   Surgery date: 04/20/2017 Surgery type: Sleeve gastrectomy Start weight at [facility]: 270 (03/26/2016) and 305.2 (04/05/2015) Weight today: 295.5   Samples given per MNT protocol. Patient educated on appropriate usage: Bariatric Advantage Multivitamin Lot # [id number] Exp: 12/2017  Bariatric Advantage Calcium Citrate Lot # [id number] Exp: 06/30/2017  Premier Protein Clear Drink Lot # [id number] Exp: 06/08/2017   The following the learning objectives were met by the patient during this course:  Identify Pre-Op Dietary Goals and will begin 2 weeks pre-operatively  Identify appropriate sources of fluids and proteins   State protein recommendations and appropriate sources pre and post-operatively  Identify Post-Operative Dietary Goals and will follow for 2 weeks post-operatively  Identify appropriate multivitamin and calcium sources  Describe the need for physical activity post-operatively and will follow MD recommendations  State when to call healthcare provider regarding medication questions or post-operative complications  Handouts given during class include:  Pre-Op Bariatric Surgery Diet Handout  Protein Shake Handout  Post-Op Bariatric Surgery Nutrition Handout  BELT Program Information Flyer  Support Group Information Flyer  [facility] Bariatric Supplements Price List  Follow-Up Plan: Patient will follow-up at [facility] 2 weeks post operatively for diet advancement per MD.

## 2017-07-28 ENCOUNTER — Encounter: Payer: Self-pay | Admitting: Cardiology

## 2017-09-06 NOTE — Progress Notes (Signed)
HPI: FU SVT. Seen in the emergency room September 29, 2010 with palpitations. Her electrocardiogram revealed SVT which broke with adenosine. Patient states she had an episode similar to this previously in [place]. TSH normal. Patient has not tolerated beta blockers in past due to cough. Therefore placed on cardizem. Echocardiogram September 2016 showed normal LV systolic function. Nuclear study February 2017 showed ejection fraction 64% and normal perfusion. Since I last saw her, the patient has dyspnea with more extreme activities but not with routine activities. It is relieved with rest. It is not associated with chest pain. There is no orthopnea, PND or pedal edema. There is no syncope or palpitations. There is no exertional chest pain.   Current Outpatient Medications  Medication Sig Dispense Refill  . aspirin 81 MG tablet Take 81 mg by mouth daily.    . calcium-vitamin D 250-100 MG-UNIT tablet Take 1 tablet by mouth 2 (two) times daily.    . COSENTYX SENSOREADY 300 DOSE 150 MG/ML SOAJ Inject 1 each as directed every 30 (thirty) days.    Marland Kitchen diltiazem (CARTIA XT) 120 MG 24 hr capsule Take 1 capsule (120 mg total) by mouth daily. 90 capsule 3  . furosemide (LASIX) 40 MG tablet Take 80 mg by mouth daily.     Marland Kitchen levothyroxine (SYNTHROID, LEVOTHROID) 112 MCG tablet Take 1 tablet by mouth daily.    . Multiple Vitamin (MULTIVITAMIN) tablet Take 1 tablet by mouth daily.    . Misc Natural Products (GLUCOSAMINE CHONDROITIN ADV PO) Take 1 tablet by mouth 2 (two) times daily.    Marland Kitchen omeprazole (PRILOSEC) 40 MG capsule Take 1 capsule by mouth 2 (two) times daily.    . potassium chloride (K-DUR,KLOR-CON) 10 MEQ tablet Take by mouth.    Marland Kitchen POTASSIUM PO Take 1 tablet by mouth daily.     No current facility-administered medications for this visit.      Past Medical History:  Diagnosis Date  . COPD (chronic obstructive pulmonary disease) ([place])   . Fatigue   . Hyperlipidemia   . Hypothyroidism   .  Joint ache   . Lower extremity edema   . Psoriasis   . SVT (supraventricular tachycardia) ([place])    a. 10/2011 Echo: EF 60-65%, Gr 2 DD.    Past Surgical History:  Procedure Laterality Date  . ANKLE SURGERY    . LUMBAR DISC SURGERY    . tonsillectomy      Social History   Socioeconomic History  . Marital status: Single    Spouse name: Not on file  . Number of children: 0  . Years of education: Not on file  . Highest education level: Not on file  Social Needs  . Financial resource strain: Not on file  . Food insecurity - worry: Not on file  . Food insecurity - inability: Not on file  . Transportation needs - medical: Not on file  . Transportation needs - non-medical: Not on file  Occupational History    Comment: Corporate investment banker  Tobacco Use  . Smoking status: Former Smoker    Packs/day: 1.00    Years: 16.00    Pack years: 16.00    Types: Cigarettes    Last attempt to quit: 07/13/1997    Years since quitting: 20.1  . Smokeless tobacco: Never Used  Substance and Sexual Activity  . Alcohol use: Yes    Alcohol/week: 4.2 oz    Types: 7 Glasses of wine per week    Comment: 1  glass of wine daily  . Drug use: Not on file  . Sexual activity: Not on file  Other Topics Concern  . Not on file  Social History Narrative  . Not on file    Family History  Problem Relation Age of Onset  . Heart disease Mother        MV repair; ICD  . Hypertension Mother   . Kidney disease Mother   . Skin cancer Maternal Grandmother   . Stroke Father   . Diabetes Father   . Hypertension Father   . Diabetes Sister     ROS: no fevers or chills, productive cough, hemoptysis, dysphasia, odynophagia, melena, hematochezia, dysuria, hematuria, rash, seizure activity, orthopnea, PND, pedal edema, claudication. Remaining systems are negative.  Physical Exam: Well-developed obese in no acute distress.  Skin is warm and dry.  HEENT is normal.  Neck is supple.  Chest is clear to auscultation  with normal expansion.  Cardiovascular exam is regular rate and rhythm.  Abdominal exam nontender or distended. No masses palpated. Extremities show no edema. neuro grossly intact  ECG-sinus bradycardia at a rate of 58.  Nonspecific ST changes.  Personally reviewed  A/P  1 supraventricular tachycardia-patient continues to do well from a symptomatic standpoint.  She has not had any recent episodes.  We will continue with Cardizem.  If she has more frequent episodes in the future we will refer to electrophysiology for consideration of ablation.  Note patient tolerated Inderal in the past but no other beta-blockers.  2 hyperlipidemia-this is managed by primary care.  3 obesity-we discussed weight loss.  She is being considered for weight [facility] surgery at [facility].  4 edema-continue present dose of diuretic.  Potassium and renal function monitored by primary care.  5 preoperative evaluation prior to weight loss surgery-patient has good functional capacity with no chest pain.  Previous nuclear study negative.  LV function normal.  She may proceed without further cardiac evaluation.  Kirk Ruths, MD

## 2017-09-14 ENCOUNTER — Ambulatory Visit: Payer: BLUE CROSS/BLUE SHIELD | Admitting: Cardiology

## 2017-09-14 ENCOUNTER — Encounter: Payer: Self-pay | Admitting: Cardiology

## 2017-09-14 VITALS — BP 130/68 | HR 58 | Ht 65.0 in | Wt 309.0 lb

## 2017-09-14 DIAGNOSIS — Z01810 Encounter for preprocedural cardiovascular examination: Secondary | ICD-10-CM | POA: Diagnosis not present

## 2017-09-14 DIAGNOSIS — I471 Supraventricular tachycardia: Secondary | ICD-10-CM

## 2017-09-14 DIAGNOSIS — E7800 Pure hypercholesterolemia, unspecified: Secondary | ICD-10-CM | POA: Diagnosis not present

## 2017-09-14 NOTE — Patient Instructions (Signed)
Your physician wants you to follow-up in: ONE YEAR WITH DR CRENSHAW You will receive a reminder letter in the mail two months in advance. If you don't receive a letter, please call our office to schedule the follow-up appointment.   If you need a refill on your cardiac medications before your next appointment, please call your pharmacy.  

## 2017-09-15 ENCOUNTER — Encounter: Payer: Self-pay | Admitting: Cardiology

## 2017-11-25 ENCOUNTER — Encounter: Payer: Self-pay | Admitting: Cardiology

## 2017-11-25 NOTE — Telephone Encounter (Signed)
Her best bet would be to switch to the immediate acting tabs.  They can be crushed if needed.  She's on XR 120 mg daily, so easiest would by IR 60 mg bid.  She could also do IR 30 mg qid, but most people would miss as many doses as they would actually take.

## 2017-11-26 NOTE — Telephone Encounter (Signed)
Her best bet would be to switch to the immediate acting tabs.  They can be crushed if needed.  She's on XR 120 mg daily, so easiest would by IR 60 mg bid.  She could also do IR 30 mg qid, but most people would miss as many doses as they would actually take.

## 2017-11-30 ENCOUNTER — Encounter: Payer: Self-pay | Admitting: Cardiology

## 2017-12-01 ENCOUNTER — Other Ambulatory Visit: Payer: Self-pay | Admitting: *Deleted

## 2017-12-01 MED ORDER — DILTIAZEM HCL 60 MG PO TABS: 60.0000 mg | ORAL_TABLET | Freq: Two times a day (BID) | ORAL | 3 refills | Status: DC

## 2017-12-03 ENCOUNTER — Other Ambulatory Visit: Payer: Self-pay | Admitting: *Deleted

## 2017-12-03 MED ORDER — DILTIAZEM HCL 60 MG PO TABS: 60.0000 mg | ORAL_TABLET | Freq: Two times a day (BID) | ORAL | 3 refills | Status: DC

## 2017-12-07 ENCOUNTER — Other Ambulatory Visit: Payer: Self-pay | Admitting: *Deleted

## 2017-12-07 MED ORDER — DILTIAZEM HCL 60 MG PO TABS: 60.0000 mg | ORAL_TABLET | Freq: Two times a day (BID) | ORAL | 3 refills | Status: DC

## 2018-01-10 HISTORY — PX: LAPAROSCOPIC GASTRIC BYPASS: SUR771

## 2018-09-09 NOTE — Progress Notes (Signed)
HPI: FU SVT. Seen in the emergency room September 29, 2010 with palpitations. Her electrocardiogram revealed SVT which broke with adenosine. Patient states she had an episode similar to this previously in [place]. TSH normal. Patient has not tolerated beta blockers in past due to cough. Therefore placed on cardizem. Echocardiogram September 2016 showed normal LV systolic function. Nuclear study February 2017 showed ejection fraction 64% and normal perfusion.Since I last saw her,  she had gastric bypass in [place].  She has done well and lost 80 pounds.  She denies dyspnea, chest pain, palpitations or syncope.  Current Outpatient Medications  Medication Sig Dispense Refill  . COSENTYX SENSOREADY 300 DOSE 150 MG/ML SOAJ Inject 1 each as directed every 30 (thirty) days.    Marland Kitchen diltiazem (CARDIZEM CD) 120 MG 24 hr capsule Take 1 capsule by mouth daily.    . fluconazole (DIFLUCAN) 100 MG tablet Take 100 mg by mouth daily.    Marland Kitchen levothyroxine (SYNTHROID, LEVOTHROID) 112 MCG tablet Take 1 tablet by mouth daily.    . Multiple Vitamin (MULTIVITAMIN) tablet Take 1 tablet by mouth daily.     No current facility-administered medications for this visit.      Past Medical History:  Diagnosis Date  . COPD (chronic obstructive pulmonary disease) ([place])   . Fatigue   . Hyperlipidemia   . Hypothyroidism   . Joint ache   . Lower extremity edema   . Psoriasis   . SVT (supraventricular tachycardia) ([place])    a. 10/2011 Echo: EF 60-65%, Gr 2 DD.    Past Surgical History:  Procedure Laterality Date  . ANKLE SURGERY    . LUMBAR DISC SURGERY    . tonsillectomy      Social History   Socioeconomic History  . Marital status: Single    Spouse name: Not on file  . Number of children: 0  . Years of education: Not on file  . Highest education level: Not on file  Occupational History    Comment: Corporate investment banker  Social Needs  . Financial resource strain: Not on file  . Food insecurity:    Worry:  Not on file    Inability: Not on file  . Transportation needs:    Medical: Not on file    Non-medical: Not on file  Tobacco Use  . Smoking status: Former Smoker    Packs/day: 1.00    Years: 16.00    Pack years: 16.00    Types: Cigarettes    Last attempt to quit: 07/13/1997    Years since quitting: 21.1  . Smokeless tobacco: Never Used  Substance and Sexual Activity  . Alcohol use: Yes    Alcohol/week: 7.0 standard drinks    Types: 7 Glasses of wine per week    Comment: 1 glass of wine daily  . Drug use: Not on file  . Sexual activity: Not on file  Lifestyle  . Physical activity:    Days per week: Not on file    Minutes per session: Not on file  . Stress: Not on file  Relationships  . Social connections:    Talks on phone: Not on file    Gets together: Not on file    Attends religious service: Not on file    Active member of club or organization: Not on file    Attends meetings of clubs or organizations: Not on file    Relationship status: Not on file  . Intimate partner violence:  Fear of current or ex partner: Not on file    Emotionally abused: Not on file    Physically abused: Not on file    Forced sexual activity: Not on file  Other Topics Concern  . Not on file  Social History Narrative  . Not on file    Family History  Problem Relation Age of Onset  . Heart disease Mother        MV repair; ICD  . Hypertension Mother   . Kidney disease Mother   . Skin cancer Maternal Grandmother   . Stroke Father   . Diabetes Father   . Hypertension Father   . Diabetes Sister     ROS: no fevers or chills, productive cough, hemoptysis, dysphasia, odynophagia, melena, hematochezia, dysuria, hematuria, rash, seizure activity, orthopnea, PND, pedal edema, claudication. Remaining systems are negative.  Physical Exam: Well-developed well-nourished in no acute distress.  Skin is warm and dry.  HEENT is normal.  Neck is supple.  Chest is clear to auscultation with normal  expansion.  Cardiovascular exam is regular rate and rhythm.  Abdominal exam nontender or distended. No masses palpated. Extremities show no edema. neuro grossly intact  ECG-sinus bradycardia at a rate of 55, nonspecific ST changes.  Personally reviewed  A/P  1 supraventricular tachycardia-patient has not had recurrent episodes.  Continue Cardizem.  Note, patient tolerated Inderal previously but has not tolerated other beta-blockers.  We can refer for ablation in the future if she has episodes refractory to medical therapy.  2 hyperlipidemia-followed by primary care.  3 obesity-patient is status post bariatric surgery and lost 80 lbs.  4 lower extremity edema-reasonably well controlled. Diuretic DCed. Continue low-sodium diet.  Kirk Ruths, MD

## 2018-09-14 ENCOUNTER — Ambulatory Visit: Payer: BLUE CROSS/BLUE SHIELD | Admitting: Cardiology

## 2018-09-14 ENCOUNTER — Encounter: Payer: Self-pay | Admitting: Cardiology

## 2018-09-14 VITALS — BP 116/64 | HR 55 | Ht 65.0 in | Wt 230.8 lb

## 2018-09-14 DIAGNOSIS — E7800 Pure hypercholesterolemia, unspecified: Secondary | ICD-10-CM | POA: Diagnosis not present

## 2018-09-14 DIAGNOSIS — I471 Supraventricular tachycardia: Secondary | ICD-10-CM | POA: Diagnosis not present

## 2018-09-14 NOTE — Patient Instructions (Signed)

## 2019-01-12 ENCOUNTER — Other Ambulatory Visit: Payer: Self-pay | Admitting: *Deleted

## 2019-01-12 MED ORDER — DILTIAZEM HCL ER COATED BEADS 120 MG PO CP24: 120.0000 mg | ORAL_CAPSULE | Freq: Every day | ORAL | 3 refills | Status: DC

## 2019-01-16 ENCOUNTER — Other Ambulatory Visit: Payer: Self-pay

## 2019-01-16 MED ORDER — DILTIAZEM HCL ER COATED BEADS 120 MG PO CP24: 120.0000 mg | ORAL_CAPSULE | Freq: Every day | ORAL | 3 refills | Status: DC

## 2019-07-19 ENCOUNTER — Other Ambulatory Visit: Payer: Self-pay | Admitting: *Deleted

## 2019-07-19 MED ORDER — DILTIAZEM HCL ER COATED BEADS 120 MG PO CP24: 120.0000 mg | ORAL_CAPSULE | Freq: Every day | ORAL | 0 refills | Status: DC

## 2019-07-21 ENCOUNTER — Other Ambulatory Visit: Payer: Self-pay

## 2019-07-21 NOTE — Telephone Encounter (Signed)
Refill

## 2019-09-18 ENCOUNTER — Other Ambulatory Visit: Payer: Self-pay | Admitting: Cardiology

## 2019-09-27 NOTE — Progress Notes (Signed)
HPI: FU SVT. Seen in the emergency room September 29, 2010 with palpitations. Her electrocardiogram revealed SVT which broke with adenosine. Patient states she had an episode similar to this previously in [place]. TSH normal. Patient has not tolerated beta blockers in past due to cough. Therefore placed on cardizem. Echocardiogram September 2016 showed normal LV systolic function. Nuclear study February 2017 showed ejection fraction 64% and normal perfusion.Since I last saw her,she denies dyspnea, chest pain, palpitations or syncope.  Current Outpatient Medications  Medication Sig Dispense Refill  . CALCIUM PO Take 1,500 Units by mouth daily.    Marland Kitchen diltiazem (CARDIZEM CD) 120 MG 24 hr capsule Take 1 capsule (120 mg total) by mouth daily. 90 capsule 0  . levothyroxine (SYNTHROID) 125 MCG tablet Take 125 mcg by mouth daily.    . Multiple Vitamin (MULTIVITAMIN) tablet Take 1 tablet by mouth daily.    . Risankizumab-rzaa,150 MG Dose, (SKYRIZI, 150 MG DOSE,) 75 MG/0.83ML PSKT      No current facility-administered medications for this visit.     Past Medical History:  Diagnosis Date  . COPD (chronic obstructive pulmonary disease) ([place])   . Fatigue   . Hyperlipidemia   . Hypothyroidism   . Joint ache   . Lower extremity edema   . Psoriasis   . SVT (supraventricular tachycardia) ([place])    a. 10/2011 Echo: EF 60-65%, Gr 2 DD.    Past Surgical History:  Procedure Laterality Date  . ANKLE SURGERY    . LUMBAR DISC SURGERY    . tonsillectomy      Social History   Socioeconomic History  . Marital status: Single    Spouse name: Not on file  . Number of children: 0  . Years of education: Not on file  . Highest education level: Not on file  Occupational History    Comment: Corporate investment banker  Tobacco Use  . Smoking status: Former Smoker    Packs/day: 1.00    Years: 16.00    Pack years: 16.00    Types: Cigarettes    Quit date: 07/13/1997    Years since quitting: 22.2  . Smokeless  tobacco: Never Used  Substance and Sexual Activity  . Alcohol use: Yes    Alcohol/week: 7.0 standard drinks    Types: 7 Glasses of wine per week    Comment: 1 glass of wine daily  . Drug use: Not on file  . Sexual activity: Not on file  Other Topics Concern  . Not on file  Social History Narrative  . Not on file   Social Determinants of Health   Financial Resource Strain:   . Difficulty of Paying Living Expenses:   Food Insecurity:   . Worried About Charity fundraiser in the Last Year:   . Arboriculturist in the Last Year:   Transportation Needs:   . Film/video editor (Medical):   Marland Kitchen Lack of Transportation (Non-Medical):   Physical Activity:   . Days of Exercise per Week:   . Minutes of Exercise per Session:   Stress:   . Feeling of Stress :   Social Connections:   . Frequency of Communication with Friends and Family:   . Frequency of Social Gatherings with Friends and Family:   . Attends Religious Services:   . Active Member of Clubs or Organizations:   . Attends Archivist Meetings:   Marland Kitchen Marital Status:   Intimate Partner Violence:   . Fear of  Current or Ex-Partner:   . Emotionally Abused:   Marland Kitchen Physically Abused:   . Sexually Abused:     Family History  Problem Relation Age of Onset  . Heart disease Mother        MV repair; ICD  . Hypertension Mother   . Kidney disease Mother   . Skin cancer Maternal Grandmother   . Stroke Father   . Diabetes Father   . Hypertension Father   . Diabetes Sister     ROS: no fevers or chills, productive cough, hemoptysis, dysphasia, odynophagia, melena, hematochezia, dysuria, hematuria, rash, seizure activity, orthopnea, PND, pedal edema, claudication. Remaining systems are negative.  Physical Exam: Well-developed well-nourished in no acute distress.  Skin is warm and dry.  HEENT is normal.  Neck is supple.  Chest is clear to auscultation with normal expansion.  Cardiovascular exam is regular rate and rhythm.    Abdominal exam nontender or distended. No masses palpated. Extremities show no edema. neuro grossly intact  ECG-sinus bradycardia at a rate of 57, RV conduction delay.  Personally reviewed  A/P  1 supraventricular tachycardia-no recurrences since last office visit.  Continue Cardizem.  She has not tolerated any beta-blocker but Inderal in the past.  If she has more frequent episodes in the future we will consider ablation.  2 hyperlipidemia-patient is managed by primary care.  3 obesity-patient continues to do well following bariatric surgery. She has lost > 100 lbs.  4 lower extremity edema-has improved and is now off of diuretics.  Kirk Ruths, MD

## 2019-10-05 ENCOUNTER — Other Ambulatory Visit: Payer: Self-pay

## 2019-10-05 ENCOUNTER — Ambulatory Visit (INDEPENDENT_AMBULATORY_CARE_PROVIDER_SITE_OTHER): Payer: 59 | Admitting: Cardiology

## 2019-10-05 ENCOUNTER — Encounter: Payer: Self-pay | Admitting: Cardiology

## 2019-10-05 VITALS — BP 104/72 | HR 57 | Ht 65.0 in | Wt 191.0 lb

## 2019-10-05 DIAGNOSIS — I471 Supraventricular tachycardia: Secondary | ICD-10-CM | POA: Diagnosis not present

## 2019-10-05 DIAGNOSIS — E7800 Pure hypercholesterolemia, unspecified: Secondary | ICD-10-CM | POA: Diagnosis not present

## 2019-10-05 NOTE — Patient Instructions (Signed)

## 2019-11-07 ENCOUNTER — Other Ambulatory Visit: Payer: Self-pay

## 2019-11-07 MED ORDER — SKYRIZI (150 MG DOSE) 75 MG/0.83ML ~~LOC~~ PSKT: 150.0000 mg | PREFILLED_SYRINGE | SUBCUTANEOUS | 2 refills | Status: DC

## 2019-11-07 NOTE — Telephone Encounter (Signed)
Patient needing prescription sent to [facility] due to new insurance.

## 2019-11-14 ENCOUNTER — Other Ambulatory Visit: Payer: Self-pay

## 2019-11-14 MED ORDER — SKYRIZI (150 MG DOSE) 75 MG/0.83ML ~~LOC~~ PSKT: 150.0000 mg | PREFILLED_SYRINGE | SUBCUTANEOUS | 2 refills | Status: DC

## 2019-11-14 NOTE — Progress Notes (Signed)
Adding ICD10 codes and Allergies to medication.

## 2020-02-13 ENCOUNTER — Other Ambulatory Visit: Payer: Self-pay

## 2020-02-13 ENCOUNTER — Ambulatory Visit (INDEPENDENT_AMBULATORY_CARE_PROVIDER_SITE_OTHER): Payer: BC Managed Care – PPO | Admitting: Dermatology

## 2020-02-13 DIAGNOSIS — D489 Neoplasm of uncertain behavior, unspecified: Secondary | ICD-10-CM | POA: Diagnosis not present

## 2020-02-13 DIAGNOSIS — D485 Neoplasm of uncertain behavior of skin: Secondary | ICD-10-CM

## 2020-02-13 DIAGNOSIS — D239 Other benign neoplasm of skin, unspecified: Secondary | ICD-10-CM

## 2020-02-13 DIAGNOSIS — L409 Psoriasis, unspecified: Secondary | ICD-10-CM | POA: Diagnosis not present

## 2020-02-13 HISTORY — DX: Other benign neoplasm of skin, unspecified: D23.9

## 2020-02-13 NOTE — Patient Instructions (Addendum)

## 2020-02-13 NOTE — Progress Notes (Signed)
   Follow-Up Visit   Subjective  Cynthia Obrien is a 54 y.o. female who presents for the following: Psoriasis.  Patient here today for 6 month follow up. Psoriasis is normally all over but patient advises she has no active areas today. She does have some joint pain but she thinks it is related to thyroid. Patient on Orson Ape and doing well.  No side effects.  No infections. She does have a spot on her right groin area that is dark like a blood blister. She is not sure how long it's been there.There is also a spot on her chin that she feels has gotten larger. She tends to pick at it. Present for a couple of years.   The following portions of the chart were reviewed this encounter and updated as appropriate:      Review of Systems:  No other skin or systemic complaints except as noted in HPI or Assessment and Plan.  Objective  Well appearing patient in no apparent distress; mood and affect are within normal limits.  All sun exposed areas plus back examined.  Objective  Trunk, extremities: clear  Objective  Left Inferior Chin: 53mm flesh papule     Objective  Right Inguinal Crease: 69mm dark brown macule      Assessment & Plan  Psoriasis Trunk, extremities  Well-controlled Cont Skyrizi 150mg  every 12 weeks, will send in rfs pending labs  Other Related Procedures Comprehensive metabolic panel Lipid panel CBC with Differential/Platelet QuantiFERON-TB Gold Plus  Neoplasm of uncertain behavior of skin Left Inferior Chin  Skin / nail biopsy Type of biopsy: tangential   Informed consent: discussed and consent obtained   Patient was prepped and draped in usual sterile fashion: Area prepped with alcohol. Anesthesia: the lesion was anesthetized in a standard fashion   Anesthetic:  1% lidocaine w/ epinephrine 1-100,000 buffered w/ 8.4% NaHCO3 Instrument used: flexible razor blade   Hemostasis achieved with: pressure, aluminum chloride and electrodesiccation   Outcome:  patient tolerated procedure well   Post-procedure details: wound care instructions given   Post-procedure details comment:  Ointment and small bandage applied  Specimen 2 - Surgical pathology Differential Diagnosis: Irritated Nevus Check Margins: No 72mm flesh papule  Neoplasm of uncertain behavior Right Inguinal Crease  Skin / nail biopsy Type of biopsy: tangential   Informed consent: discussed and consent obtained   Patient was prepped and draped in usual sterile fashion: Area prepped with alcohol. Anesthesia: the lesion was anesthetized in a standard fashion   Anesthetic:  1% lidocaine w/ epinephrine 1-100,000 buffered w/ 8.4% NaHCO3 Instrument used: flexible razor blade   Hemostasis achieved with: pressure, aluminum chloride and electrodesiccation   Outcome: patient tolerated procedure well   Post-procedure details: wound care instructions given   Post-procedure details comment:  Ointment and small bandage applied  Specimen 1 - Surgical pathology Differential Diagnosis: Nevus r/o Dysplasia Check Margins: No 54mm dark brown macule   Long term medication management.  Return in about 6 months (around 08/15/2020) for psoriasis.  Graciella Belton, RMA, am acting as scribe for [name], MD . Documentation: I have reviewed the above documentation for accuracy and completeness, and I agree with the above.  [name] MD

## 2020-02-15 LAB — CBC WITH DIFFERENTIAL/PLATELET
Basophils Absolute: 0.1 10*3/uL (ref 0.0–0.2)
Basos: 1 %
EOS (ABSOLUTE): 0.1 10*3/uL (ref 0.0–0.4)
Eos: 2 %
Hematocrit: 37.7 % (ref 34.0–46.6)
Hemoglobin: 12.5 g/dL (ref 11.1–15.9)
Immature Grans (Abs): 0 10*3/uL (ref 0.0–0.1)
Immature Granulocytes: 0 %
Lymphocytes Absolute: 2.1 10*3/uL (ref 0.7–3.1)
Lymphs: 33 %
MCH: 28.4 pg (ref 26.6–33.0)
MCHC: 33.2 g/dL (ref 31.5–35.7)
MCV: 86 fL (ref 79–97)
Monocytes Absolute: 0.7 10*3/uL (ref 0.1–0.9)
Monocytes: 11 %
Neutrophils Absolute: 3.4 10*3/uL (ref 1.4–7.0)
Neutrophils: 53 %
Platelets: 230 10*3/uL (ref 150–450)
RBC: 4.4 x10E6/uL (ref 3.77–5.28)
RDW: 14.7 % (ref 11.7–15.4)
WBC: 6.4 10*3/uL (ref 3.4–10.8)

## 2020-02-15 LAB — COMPREHENSIVE METABOLIC PANEL
ALT: 32 IU/L (ref 0–32)
AST: 32 IU/L (ref 0–40)
Albumin/Globulin Ratio: 1.8 (ref 1.2–2.2)
Albumin: 4.4 g/dL (ref 3.8–4.9)
Alkaline Phosphatase: 74 IU/L (ref 48–121)
BUN/Creatinine Ratio: 40 — ABNORMAL HIGH (ref 9–23)
BUN: 40 mg/dL — ABNORMAL HIGH (ref 6–24)
Bilirubin Total: 0.4 mg/dL (ref 0.0–1.2)
CO2: 26 mmol/L (ref 20–29)
Calcium: 9.6 mg/dL (ref 8.7–10.2)
Chloride: 104 mmol/L (ref 96–106)
Creatinine, Ser: 1 mg/dL (ref 0.57–1.00)
GFR calc Af Amer: 73 mL/min/{1.73_m2} (ref >59)
GFR calc non Af Amer: 63 mL/min/{1.73_m2} (ref >59)
Globulin, Total: 2.5 g/dL (ref 1.5–4.5)
Glucose: 70 mg/dL (ref 65–99)
Potassium: 4.8 mmol/L (ref 3.5–5.2)
Sodium: 144 mmol/L (ref 134–144)
Total Protein: 6.9 g/dL (ref 6.0–8.5)

## 2020-02-15 LAB — LIPID PANEL
Chol/HDL Ratio: 3.1 ratio (ref 0.0–4.4)
Cholesterol, Total: 191 mg/dL (ref 100–199)
HDL: 61 mg/dL (ref >39)
LDL Chol Calc (NIH): 117 mg/dL — ABNORMAL HIGH (ref 0–99)
Triglycerides: 69 mg/dL (ref 0–149)
VLDL Cholesterol Cal: 13 mg/dL (ref 5–40)

## 2020-02-15 LAB — QUANTIFERON-TB GOLD PLUS
QuantiFERON Mitogen Value: >10 IU/mL
QuantiFERON Nil Value: 0 IU/mL
QuantiFERON TB1 Ag Value: 0.03 IU/mL
QuantiFERON TB2 Ag Value: 0.02 IU/mL
QuantiFERON-TB Gold Plus: NEGATIVE

## 2020-02-19 ENCOUNTER — Telehealth: Payer: Self-pay

## 2020-02-19 MED ORDER — SKYRIZI (150 MG DOSE) 75 MG/0.83ML ~~LOC~~ PSKT: 150.0000 mg | PREFILLED_SYRINGE | SUBCUTANEOUS | 5 refills | Status: DC

## 2020-02-19 NOTE — Telephone Encounter (Signed)
-----   Message from [name], MD sent at 02/19/2020  1:05 PM EDT ----- Labs ok, For Renal function, BUN was a little elevated, but she might have been dehydrated. Cr is okay.  TB neg.  Continue Skyrizi, can send in 6 rfs

## 2020-02-19 NOTE — Telephone Encounter (Signed)
Advised pt of labs and bx results.  Advised pt we would send in 6 rfs of skyrizi./sh

## 2020-03-11 ENCOUNTER — Other Ambulatory Visit: Payer: Self-pay

## 2020-03-11 DIAGNOSIS — L409 Psoriasis, unspecified: Secondary | ICD-10-CM

## 2020-03-11 MED ORDER — SKYRIZI (150 MG DOSE) 75 MG/0.83ML ~~LOC~~ PSKT: 150.0000 mg | PREFILLED_SYRINGE | SUBCUTANEOUS | 2 refills | Status: DC

## 2020-03-11 NOTE — Progress Notes (Signed)
New dosage forms available.

## 2020-03-13 ENCOUNTER — Other Ambulatory Visit: Payer: Self-pay

## 2020-03-13 DIAGNOSIS — L409 Psoriasis, unspecified: Secondary | ICD-10-CM

## 2020-03-13 MED ORDER — SKYRIZI 150 MG/ML ~~LOC~~ SOSY: 150.0000 mg | PREFILLED_SYRINGE | SUBCUTANEOUS | 2 refills | Status: DC

## 2020-03-13 NOTE — Progress Notes (Signed)
New pharmacy

## 2020-03-25 ENCOUNTER — Other Ambulatory Visit: Payer: Self-pay

## 2020-03-25 NOTE — Progress Notes (Signed)
Discontinued old prescriptions

## 2020-04-22 MED ORDER — DILTIAZEM HCL ER COATED BEADS 120 MG PO CP24: 120.0000 mg | ORAL_CAPSULE | Freq: Every day | ORAL | 0 refills | Status: DC

## 2020-04-25 ENCOUNTER — Other Ambulatory Visit: Payer: Self-pay

## 2020-04-25 DIAGNOSIS — L409 Psoriasis, unspecified: Secondary | ICD-10-CM

## 2020-04-25 MED ORDER — SKYRIZI 150 MG/ML ~~LOC~~ SOSY: 150.0000 mg | PREFILLED_SYRINGE | SUBCUTANEOUS | 2 refills | Status: DC

## 2020-04-25 NOTE — Progress Notes (Signed)
Prescription request from [facility]

## 2020-06-12 ENCOUNTER — Other Ambulatory Visit: Payer: Self-pay

## 2020-06-12 ENCOUNTER — Encounter: Payer: Self-pay | Admitting: Dermatology

## 2020-06-12 ENCOUNTER — Ambulatory Visit (INDEPENDENT_AMBULATORY_CARE_PROVIDER_SITE_OTHER): Payer: BC Managed Care – PPO | Admitting: Dermatology

## 2020-06-12 DIAGNOSIS — Z86018 Personal history of other benign neoplasm: Secondary | ICD-10-CM

## 2020-06-12 DIAGNOSIS — L409 Psoriasis, unspecified: Secondary | ICD-10-CM

## 2020-06-12 DIAGNOSIS — Z79899 Other long term (current) drug therapy: Secondary | ICD-10-CM | POA: Diagnosis not present

## 2020-06-12 DIAGNOSIS — L4050 Arthropathic psoriasis, unspecified: Secondary | ICD-10-CM | POA: Diagnosis not present

## 2020-06-12 MED ORDER — TALTZ 80 MG/ML ~~LOC~~ SOAJ: 80.0000 mg | SUBCUTANEOUS | 1 refills | Status: DC

## 2020-06-12 MED ORDER — TALTZ 80 MG/ML ~~LOC~~ SOAJ: 80.0000 mg | Freq: Once | SUBCUTANEOUS | 0 refills | Status: AC

## 2020-06-12 MED ORDER — TALTZ 80 MG/ML ~~LOC~~ SOAJ: 160.0000 mg | SUBCUTANEOUS | 0 refills | Status: DC

## 2020-06-12 NOTE — Progress Notes (Signed)
Follow-Up Visit   Subjective  Cynthia Obrien is a 54 y.o. female who presents for the following: Psoriasis (3 month recheck. Next injection of Orson Ape is due this Saturday.  Patient reports she has no active psoriatic lesions but joint pain has recurred. Pain in feet, toes, knees. On Skyrizi, tolerating well, no adverse reactions or injection site reactions. Recent TSH WNL per patient. Has tried and failed Otezla, Humira, Enbrel, Cosentyx.) and Follow-up (HxDN with mild atypia. Right inguinal crease. ).    The following portions of the chart were reviewed this encounter and updated as appropriate:     Review of Systems: No other skin or systemic complaints except as noted in HPI or Assessment and Plan.  Objective  Well appearing patient in no apparent distress; mood and affect are within normal limits.  A focused examination was performed including face, arms, hands, knees, legs, feet. Relevant physical exam findings are noted in the Assessment and Plan.  Objective  arms, legs, torso: Clear today. No active psoriatic lesions.  Objective  Right inquinal crease: Healing pink biopsy site.   Objective  Knees (right worse than left), toes, fingers, elbows, feet: +morning stiffness- now starting to last all day. Difficulty walking. Swelling at right knee. Aching in feet/knees. Mild joint deformity at DIPs, PIPs hands.   Assessment & Plan  Psoriasis arms, legs, torso  Skin well-controlled, arthritis is worsening Continue Skyrizi for now, take next injection due on Saturday.   Plan to start [place] for arthritis pending insurance approval and will d/c Skyrizi at that time  Psoriasis is a chronic non-curable, but treatable genetic/hereditary disease that may have other systemic features affecting other organ systems such as joints (Psoriatic Arthritis). It is associated with an increased risk of inflammatory bowel disease, heart disease, non-alcoholic fatty liver disease, and  depression.   Reviewed risks of biologics including immunosuppression, infections, injection site reaction, and failure to improve condition. Goal is control of skin condition, not cure.  Some older biologics such as Humira and Enbrel may slightly increase risk of malignancy and may worsen congestive heart failure. The use of biologics requires long term medication management, including periodic office visits and monitoring of blood work.     Other Related Medications Risankizumab-rzaa (SKYRIZI) 150 MG/ML SOSY  History of dysplastic nevus Right inquinal crease  Bx proven Mild Dysplastic nevus.  Clear. Observe for recurrence. Call clinic for new or changing moles.  Recommend regular skin exams, daily broad-spectrum spf 30+ sunscreen use, and photoprotection.     Psoriatic arthritis ([place]) Knees (right worse than left), toes, fingers, elbows, feet  Plan to start [place] pending insurance approval.   May need referral to rheumatology if not improving with Taltz.  Consider adding in methotrexate if not improving.  Reviewed risks of biologics including immunosuppression, infections, injection site reaction, and failure to improve condition. Goal is control of skin condition, not cure.  Some older biologics such as Humira and Enbrel may slightly increase risk of malignancy and may worsen congestive heart failure. The use of biologics requires long term medication management, including periodic office visits and monitoring of blood work.   Ixekizumab (TALTZ) 80 MG/ML SOAJ - Knees (right worse than left), toes, fingers, elbows, feet  Ixekizumab (TALTZ) 80 MG/ML SOAJ - Knees (right worse than left), toes, fingers, elbows, feet  Ixekizumab (TALTZ) 80 MG/ML SOAJ - Knees (right worse than left), toes, fingers, elbows, feet  Ixekizumab (TALTZ) 80 MG/ML SOAJ - Knees (right worse than left), toes, fingers, elbows, feet  Return for recheck as scheduled.   I, Emelia Salisbury, CMA, am acting as scribe for  [name], MD.  Documentation: I have reviewed the above documentation for accuracy and completeness, and I agree with the above.  [name] MD

## 2020-06-12 NOTE — Patient Instructions (Signed)
Reviewed risks of biologics including immunosuppression, infections, injection site reaction, and failure to improve condition. Goal is control of skin condition, not cure.  Some older biologics such as Humira and Enbrel may slightly increase risk of malignancy and may worsen congestive heart failure. The use of biologics requires long term medication management, including periodic office visits and monitoring of blood work.   

## 2020-07-21 ENCOUNTER — Other Ambulatory Visit: Payer: Self-pay | Admitting: Cardiology

## 2020-08-27 ENCOUNTER — Other Ambulatory Visit: Payer: Self-pay

## 2020-08-27 ENCOUNTER — Ambulatory Visit (INDEPENDENT_AMBULATORY_CARE_PROVIDER_SITE_OTHER): Payer: BC Managed Care – PPO | Admitting: Dermatology

## 2020-08-27 DIAGNOSIS — L409 Psoriasis, unspecified: Secondary | ICD-10-CM | POA: Diagnosis not present

## 2020-08-27 NOTE — Progress Notes (Signed)
   Follow-Up Visit   Subjective  Cynthia Obrien is a 55 y.o. female who presents for the following: Psoriasis (Patient is here today for 6 month psoriasis follow up. She states she is taking taltz 80 mg injection. She reports she has not noticed a lot of change. She still has arthritis in right knee, hands and feet. Patient states she is clear today of plaques. ).  No side effects, no infections.  She get redness/swelling around injection site.  Skin is doing great, even though joints are still bothering her.  Patient has tried multiple biologics including skyrizi, Forensic psychologist, humira, cosentyx.    The following portions of the chart were reviewed this encounter and updated as appropriate:       Objective  Well appearing patient in no apparent distress; mood and affect are within normal limits.  A focused examination was performed including bilateral arms, bilateral legs, bilateral elbows, scalp, bilateral ears. Relevant physical exam findings are noted in the Assessment and Plan.  Objective  arms, legs, scalp, elbows, ears,: Arms - clear Elbows - xerosis  Scalp - clear Ears - clear  Legs - clear   Lab reviewed from 02/13/20  Assessment & Plan  Psoriasis arms, legs, scalp, elbows, ears,  Skin much improved on Talz. Still having joint pain in right knee, hands and feet.  Recommend seeing rheumatologist due to persistent arthritis.  Continue Taltz 80 mg / ml injection every 14 days.   If some irritation around injection spot treat with topical steroid (pt has clobetasol).   Psoriasis - severe on systemic "biologic" treatment injections.  Psoriasis is a chronic non-curable, but treatable genetic/hereditary disease that may have other systemic features affecting other organ systems such as joints (Psoriatic Arthritis).  It is linked with heart disease, inflammatory bowel disease, non-alcoholic fatty liver disease, and depression. Significant skin psoriasis and/or psoriatic arthritis may  have significant symptoms and affects activities of daily activity and often benefits from systemic "biologic" injection treatments.  These "biologic" treatments have some potential side effects including immunosuppression and require pre-treatment laboratory screening and periodic laboratory monitoring and periodic in person evaluation and monitoring by the attending dermatologist physician.   Reviewed risks of biologics including immunosuppression, infections, injection site reaction, and failure to improve condition. Goal is control of skin condition, not cure.  Some older biologics such as Humira and Enbrel may slightly increase risk of malignancy and may worsen congestive heart failure. The use of biologics requires long term medication management, including periodic office visits and monitoring of blood work.        Return in about 6 months (around 02/24/2021) for psoriasis follow up.  I, Ruthell Rummage, CMA, am acting as scribe for [name], MD.  Documentation: I have reviewed the above documentation for accuracy and completeness, and I agree with the above.  [name] MD

## 2020-10-01 NOTE — Progress Notes (Signed)
HPI: FU SVT. Seen in the emergency room September 29, 2010 with palpitations. Her electrocardiogram revealed SVT which broke with adenosine. Patient states she had an episode similar to this previously in [place]. TSH normal. Patient has not tolerated beta blockers in past due to cough. Therefore placed on cardizem. Echocardiogram September 2016 showed normal LV systolic function. Nuclear study February 2017 showed ejection fraction 64% and normal perfusion.Since I last saw her,his increased dyspnea, chest pain or syncope.  Occasional brief flutters but no sustained palpitations consistent with her SVT.  Current Outpatient Medications  Medication Sig Dispense Refill  . CALCIUM PO Take 1,500 Units by mouth daily.    Marland Kitchen diltiazem (CARDIZEM CD) 120 MG 24 hr capsule Take 1 capsule by mouth once daily 90 capsule 0  . Ixekizumab (TALTZ) 80 MG/ML SOAJ Inject 160 mg into the skin as directed. Inject 160 mg (2 x 80 mg) subcutaneous at week 0, then begin first induction dose 80 mg (1 x 80 mg) 2 weeks later (week 2). 3 mL 0  . Ixekizumab (TALTZ) 80 MG/ML SOAJ Inject 80 mg into the skin every 14 (fourteen) days. Weeks 4-10. 2 mL 1  . Ixekizumab (TALTZ) 80 MG/ML SOAJ Inject 80 mg into the skin every 28 (twenty-eight) days. For maintenance. 1 mL 1  . levothyroxine (SYNTHROID) 125 MCG tablet Take 125 mcg by mouth daily.    . Multiple Vitamin (MULTIVITAMIN) tablet Take 1 tablet by mouth daily.     No current facility-administered medications for this visit.     Past Medical History:  Diagnosis Date  . COPD (chronic obstructive pulmonary disease) ([place])   . Dysplastic nevus 02/13/2020   right inguinal crease, mild atypia   . Fatigue   . Hyperlipidemia   . Hypothyroidism   . Joint ache   . Lower extremity edema   . Psoriasis   . SVT (supraventricular tachycardia) ([place])    a. 10/2011 Echo: EF 60-65%, Gr 2 DD.    Past Surgical History:  Procedure Laterality Date  . ANKLE SURGERY    . LUMBAR DISC  SURGERY    . tonsillectomy      Social History   Socioeconomic History  . Marital status: Single    Spouse name: Not on file  . Number of children: 0  . Years of education: Not on file  . Highest education level: Not on file  Occupational History    Comment: Corporate investment banker  Tobacco Use  . Smoking status: Former Smoker    Packs/day: 1.00    Years: 16.00    Pack years: 16.00    Types: Cigarettes    Quit date: 07/13/1997    Years since quitting: 23.2  . Smokeless tobacco: Never Used  Substance and Sexual Activity  . Alcohol use: Yes    Alcohol/week: 7.0 standard drinks    Types: 7 Glasses of wine per week    Comment: 1 glass of wine daily  . Drug use: Not on file  . Sexual activity: Not on file  Other Topics Concern  . Not on file  Social History Narrative  . Not on file   Social Determinants of Health   Financial Resource Strain: Not on file  Food Insecurity: Not on file  Transportation Needs: Not on file  Physical Activity: Not on file  Stress: Not on file  Social Connections: Not on file  Intimate Partner Violence: Not on file    Family History  Problem Relation Age of Onset  .  Heart disease Mother        MV repair; ICD  . Hypertension Mother   . Kidney disease Mother   . Skin cancer Maternal Grandmother   . Stroke Father   . Diabetes Father   . Hypertension Father   . Diabetes Sister     ROS: no fevers or chills, productive cough, hemoptysis, dysphasia, odynophagia, melena, hematochezia, dysuria, hematuria, rash, seizure activity, orthopnea, PND, pedal edema, claudication. Remaining systems are negative.  Physical Exam: Well-developed well-nourished in no acute distress.  Skin is warm and dry.  HEENT is normal.  Neck is supple.  Chest is clear to auscultation with normal expansion.  Cardiovascular exam is regular rate and rhythm.  Abdominal exam nontender or distended. No masses palpated. Extremities show no edema. neuro grossly  intact  ECG-normal sinus rhythm, no ST changes.  Personally reviewed  A/P  1 SVT-no recurrences since last seen.  We will continue Cardizem.  Note she has not tolerated any beta-blocker previously other than Inderal.  Can consider referral for ablation in the future if she has more frequent episodes.  2 hyperlipidemia-followed by primary care.  3 obesity-patient has had previous bariatric surgery and lost over 100 pounds.  4 lower extremity edema-she has occasional pedal edema.  I have given her prescription for Lasix 20 mg daily as needed.  She is due for blood work in mid April with her primary care which should include potassium and renal function.   Kirk Ruths, MD

## 2020-10-07 ENCOUNTER — Other Ambulatory Visit: Payer: Self-pay

## 2020-10-07 ENCOUNTER — Encounter: Payer: Self-pay | Admitting: Cardiology

## 2020-10-07 ENCOUNTER — Ambulatory Visit (INDEPENDENT_AMBULATORY_CARE_PROVIDER_SITE_OTHER): Payer: BC Managed Care – PPO | Admitting: Cardiology

## 2020-10-07 VITALS — BP 110/68 | HR 60 | Ht 65.0 in | Wt 215.0 lb

## 2020-10-07 DIAGNOSIS — R600 Localized edema: Secondary | ICD-10-CM

## 2020-10-07 DIAGNOSIS — E7800 Pure hypercholesterolemia, unspecified: Secondary | ICD-10-CM

## 2020-10-07 DIAGNOSIS — I471 Supraventricular tachycardia: Secondary | ICD-10-CM

## 2020-10-07 MED ORDER — DILTIAZEM HCL ER COATED BEADS 120 MG PO CP24
120.0000 mg | ORAL_CAPSULE | Freq: Every day | ORAL | 3 refills | Status: DC
Start: 2020-10-07 — End: 2021-10-20

## 2020-10-07 MED ORDER — FUROSEMIDE 20 MG PO TABS: 20.0000 mg | ORAL_TABLET | Freq: Every day | ORAL | 3 refills | Status: DC | PRN

## 2020-10-07 NOTE — Patient Instructions (Signed)

## 2020-11-11 ENCOUNTER — Other Ambulatory Visit: Payer: Self-pay

## 2020-11-11 DIAGNOSIS — L4050 Arthropathic psoriasis, unspecified: Secondary | ICD-10-CM

## 2020-11-11 MED ORDER — TALTZ 80 MG/ML ~~LOC~~ SOAJ: 80.0000 mg | SUBCUTANEOUS | 5 refills | Status: DC

## 2021-02-19 ENCOUNTER — Other Ambulatory Visit: Payer: Self-pay | Admitting: Internal Medicine

## 2021-02-19 DIAGNOSIS — N183 Chronic kidney disease, stage 3 unspecified: Secondary | ICD-10-CM

## 2021-02-25 ENCOUNTER — Ambulatory Visit: Payer: BC Managed Care – PPO | Admitting: Dermatology

## 2021-02-27 ENCOUNTER — Ambulatory Visit
Admission: RE | Admit: 2021-02-27 | Discharge: 2021-02-27 | Disposition: A | Discharge status: Home / Self Care | Payer: BC Managed Care – PPO | Source: Ambulatory Visit | Attending: Internal Medicine | Admitting: Internal Medicine

## 2021-02-27 DIAGNOSIS — N183 Chronic kidney disease, stage 3 unspecified: Secondary | ICD-10-CM

## 2021-04-01 ENCOUNTER — Other Ambulatory Visit: Payer: Self-pay

## 2021-04-01 ENCOUNTER — Ambulatory Visit (INDEPENDENT_AMBULATORY_CARE_PROVIDER_SITE_OTHER): Payer: BC Managed Care – PPO | Admitting: Dermatology

## 2021-04-01 DIAGNOSIS — L409 Psoriasis, unspecified: Secondary | ICD-10-CM | POA: Diagnosis not present

## 2021-04-01 DIAGNOSIS — L4050 Arthropathic psoriasis, unspecified: Secondary | ICD-10-CM | POA: Diagnosis not present

## 2021-04-01 DIAGNOSIS — L719 Rosacea, unspecified: Secondary | ICD-10-CM

## 2021-04-01 MED ORDER — FINACEA 15 % EX FOAM: CUTANEOUS | 5 refills | Status: DC

## 2021-04-01 NOTE — Progress Notes (Signed)
   Follow-Up Visit   Subjective  Cynthia Obrien is a 55 y.o. female who presents for the following: Psoriasis (Patient presents for 6 month follow-up. Arms, legs, scalp, elbows, ears are clear on Taltz injections. She still has joint pain, but has improved some. She saw a rheumatologist and he started her on MTX and folic acid. Joint pain improved while taking, but patient had to d/c due to kidney function decline. No infections.) and Rosacea (Face, using metronidazole gel daily. No bumps. ).   The following portions of the chart were reviewed this encounter and updated as appropriate:       Review of Systems:  No other skin or systemic complaints except as noted in HPI or Assessment and Plan.  Objective  Well appearing patient in no apparent distress; mood and affect are within normal limits.  A focused examination was performed including face, arms, scalp. Relevant physical exam findings are noted in the Assessment and Plan.  arms, legs, scalp, elbows, ears Skin clear.  face Erythema with telangiectasias of the nose and malar cheeks.   Assessment & Plan  Psoriasis arms, legs, scalp, elbows, ears  With PsA , Skin well-controlled on Talz, some improvement of joints  Psoriasis - severe on systemic "biologic" treatment injections.  Psoriasis is a chronic non-curable, but treatable genetic/hereditary disease that may have other systemic features affecting other organ systems such as joints (Psoriatic Arthritis).  It is linked with heart disease, inflammatory bowel disease, non-alcoholic fatty liver disease, and depression. Significant skin psoriasis and/or psoriatic arthritis may have significant symptoms and affects activities of daily activity and often benefits from systemic "biologic" injection treatments.  These "biologic" treatments have some potential side effects including immunosuppression and require pre-treatment laboratory screening and periodic laboratory monitoring and  periodic in person evaluation and monitoring by the attending dermatologist physician (long term medication management).   CBC from 12/16/20 reviewed and nl. CMP and Quantiferon-TB Gold ordered.  Pending labs, continue Taltz 80mg /mL injections q 28 days for maintenance. [name])   Reviewed risks of biologics including immunosuppression, infections, injection site reaction, and failure to improve condition. Goal is control of skin condition, not cure.  Some older biologics such as Humira and Enbrel may slightly increase risk of malignancy and may worsen congestive heart failure. The use of biologics requires long term medication management, including periodic office visits and monitoring of blood work.   Related Procedures Comprehensive metabolic panel QuantiFERON-TB Gold Plus  Rosacea face  Rosacea is a chronic progressive skin condition usually affecting the face of adults, causing redness and/or acne bumps. It is treatable but not curable. It sometimes affects the eyes (ocular rosacea) as well. It may respond to topical and/or systemic medication and can flare with stress, sun exposure, alcohol, exercise and some foods.  Daily application of broad spectrum spf 30+ sunscreen to face is recommended to reduce flares.  Start Finacea foam qd/bid Discussed BBL laser if not redness improving, cosmetic noncovered procedure  Azelaic Acid (FINACEA) 15 % FOAM - face Apply to face 1-2 times a day for rosacea.  Return in about 6 months (around 09/29/2021) for Psoriasis.  IJamesetta Orleans, CMA, am acting as scribe for [name], MD . Documentation: I have reviewed the above documentation for accuracy and completeness, and I agree with the above.  [name] MD

## 2021-04-01 NOTE — Patient Instructions (Addendum)
Reviewed risks of biologics including immunosuppression, infections, injection site reaction, and failure to improve condition. Goal is control of skin condition, not cure.  Some older biologics such as Humira and Enbrel may slightly increase risk of malignancy and may worsen congestive heart failure. The use of biologics requires long term medication management, including periodic office visits and monitoring of blood work.   Rosacea  What is rosacea? Rosacea (say: ro-zay-sha) is a common skin disease that usually begins as a trend of flushing or blushing easily.  As rosacea progresses, a persistent redness in the center of the face will develop and may gradually spread beyond the nose and cheeks to the forehead and chin.  In some cases, the ears, chest, and back could be affected.  Rosacea may appear as tiny blood vessels or small red bumps that occur in crops.  Frequently they can contain pus, and are called "pustules".  If the bumps do not contain pus, they are referred to as "papules".  Rarely, in prolonged, untreated cases of rosacea, the oil glands of the nose and cheeks may become permanently enlarged.  This is called rhinophyma, and is seen more frequently in men.  Signs and Risks In its beginning stages, rosacea tends to come and go, which makes it difficult to recognize.  It can start as intermittent flushing of the face.  Eventually, blood vessels may become permanently visible.  Pustules and papules can appear, but can be mistaken for adult acne.  People of all races, ages, genders and ethnic groups are at risk of developing rosacea.  However, it is more common in women (especially around menopause) and adults with fair skin between the ages of 13 and 5.  Treatment Dermatologists typically recommend a combination of treatments to effectively manage rosacea.  Treatment can improve symptoms and may stop the progression of the rosacea.  Treatment may involve both topical and oral medications.   The tetracycline antibiotics are often used for their anti-inflammatory effect; however, because of the possibility of developing antibiotic resistance, they should not be used long term at full dose.  For dilated blood vessels the options include electrodessication (uses electric current through a small needle), laser treatment, and cosmetics to hide the redness.   With all forms of treatment, improvement is a slow process, and patients may not see any results for the first 3-4 weeks.  It is very important to avoid the sun and other triggers.  Patients must wear sunscreen daily.  Skin Care Instructions: Cleanse the skin with a mild soap such as CeraVe cleanser, Cetaphil cleanser, or Dove soap once or twice daily as needed. Moisturize with Eucerin Redness Relief Daily Perfecting Lotion (has a subtle green tint), CeraVe Moisturizing Cream, or Oil of Olay Daily Moisturizer with sunscreen every morning and/or night as recommended. Makeup should be "non-comedogenic" (won't clog pores) and be labeled "for sensitive skin". Good choices for cosmetics are: Neutrogena, Almay, and Physician's Formula.  Any product with a green tint tends to offset a red complexion. If your eyes are dry and irritated, use artificial tears 2-3 times per day and cleanse the eyelids daily with baby shampoo.  Have your eyes examined at least every 2 years.  Be sure to tell your eye doctor that you have rosacea. Alcoholic beverages tend to cause flushing of the skin, and may make rosacea worse. Always wear sunscreen, protect your skin from extreme hot and cold temperatures, and avoid spicy foods, hot drinks, and mechanical irritation such as rubbing, scrubbing, or massaging  the face.  Avoid harsh skin cleansers, cleansing masks, astringents, and exfoliation. If a particular product burns or makes your face feel tight, then it is likely to flare your rosacea. If you are having difficulty finding a sunscreen that you can tolerate, you may  try switching to a chemical-free sunscreen.  These are ones whose active ingredient is zinc oxide or titanium dioxide only.  They should also be fragrance free, non-comedogenic, and labeled for sensitive skin. Rosacea triggers may vary from person to person.  There are a variety of foods that have been reported to trigger rosacea.  Some patients find that keeping a diary of what they were doing when they flared helps them avoid triggers.  If you have any questions or concerns for your doctor, please call our main line at [phone] and press option 4 to reach your doctor's medical assistant. If no one answers, please leave a voicemail as directed and we will return your call as soon as possible. Messages left after 4 pm will be answered the following business day.   You may also send [place] a message via [place]. We typically respond to MyChart messages within 1-2 business days.  For prescription refills, please ask your pharmacy to contact our office. Our fax number is [phone].  If you have an urgent issue when the clinic is closed that cannot wait until the next business day, you can page your doctor at the number below.    Please note that while we do our best to be available for urgent issues outside of office hours, we are not available 24/7.   If you have an urgent issue and are unable to reach [place], you may choose to seek medical care at your doctor's office, retail clinic, urgent care center, or emergency room.  If you have a medical emergency, please immediately call 911 or go to the emergency department.  Pager Numbers  - Dr. Nehemiah Massed: [phone]  - Dr. Laurence Ferrari: [phone]  - Dr. Nicole Kindred: [phone]  In the event of inclement weather, please call our main line at [phone] for an update on the status of any delays or closures.  Dermatology Medication Tips: Please keep the boxes that topical medications come in in order to help keep track of the instructions about where  and how to use these. Pharmacies typically print the medication instructions only on the boxes and not directly on the medication tubes.   If your medication is too expensive, please contact our office at [phone] option 4 or send [place] a message through [place].   We are unable to tell what your co-pay for medications will be in advance as this is different depending on your insurance coverage. However, we may be able to find a substitute medication at lower cost or fill out paperwork to get insurance to cover a needed medication.   If a prior authorization is required to get your medication covered by your insurance company, please allow [place] 1-2 business days to complete this process.  Drug prices often vary depending on where the prescription is filled and some pharmacies may offer cheaper prices.  The website www.goodrx.com contains coupons for medications through different pharmacies. The prices here do not account for what the cost may be with help from insurance (it may be cheaper with your insurance), but the website can give you the price if you did not use any insurance.  - You can print the associated coupon and take it with your prescription to the pharmacy.  -  You may also stop by our office during regular business hours and pick up a GoodRx coupon card.  - If you need your prescription sent electronically to a different pharmacy, notify our office through [facility] or by phone at [phone] option 4.

## 2021-04-22 ENCOUNTER — Ambulatory Visit: Payer: Self-pay

## 2021-04-22 ENCOUNTER — Encounter: Payer: Self-pay | Admitting: Physician Assistant

## 2021-04-22 ENCOUNTER — Ambulatory Visit (INDEPENDENT_AMBULATORY_CARE_PROVIDER_SITE_OTHER): Payer: BC Managed Care – PPO | Admitting: Physician Assistant

## 2021-04-22 DIAGNOSIS — M19012 Primary osteoarthritis, left shoulder: Secondary | ICD-10-CM | POA: Diagnosis not present

## 2021-04-22 DIAGNOSIS — M25512 Pain in left shoulder: Secondary | ICD-10-CM | POA: Diagnosis not present

## 2021-04-22 NOTE — Progress Notes (Signed)
Office Visit Note   Patient: Cynthia Obrien           Date of Birth: 1965/12/12           MRN: [id number] Visit Date: 04/22/2021              Requested by: Jilda Panda, MD [address] [place],  [place] [address] PCP: Jilda Panda, MD   Assessment & Plan: Visit Diagnoses:  1. Acute pain of left shoulder     Plan: We will send patient for an intra-articular injection of her left shoulder.  Given the arthritic changes on radiographs recommend follow-up with Dr. Marlou Sa after the injection to see how she is doing overall.  She needs to modify her workout recommend she stay away from push-ups bench press and military press.  Questions were encouraged and answered at length.  Follow-Up Instructions: Return in about 4 weeks (around 05/20/2021), or Dr. Marlou Sa.   Orders:  Orders Placed This Encounter  Procedures   XR Shoulder Left   No orders of the defined types were placed in this encounter.     Procedures: No procedures performed   Clinical Data: No additional findings.   Subjective: Chief Complaint  Patient presents with   Left Shoulder - Pain    HPI Cynthia Obrien 55 year old female were seen for the first time for left shoulder pain.  She states she has had pain in the shoulder since Labor Day no known injury.  She thinks is due to overuse.  She likes to lift weights or exercise.  She had to stop doing push-ups due to the pain in the shoulder.  She has tried prednisone orally for 6 days which helped but this pain slowly returned.  She is also tried heat and ice.  She has had no injections no physical therapy.  She denies any numbness tingling down either arm.  She is right-hand dominant.  Patient is nondiabetic.  Review of Systems Review of systems see HPI otherwise negative  Objective: Vital Signs: There were no vitals taken for this visit.  Physical Exam General well-developed well-nourished female no acute distress mood and affect appropriate Ortho Exam Bilateral  shoulders: Slight weakness left shoulder with external rotation against resistance.  Impingement testing positive on the left.  Liftoff test is negative bilaterally empty can test negative bilaterally.  Biceps 5 and 5 strength against resistance bilaterally.  Distal biceps tendons are intact and nontender.  She is nontender throughout the biceps muscle bellies bilaterally. Specialty Comments:  No specialty comments available.  Imaging: XR Shoulder Left  Result Date: 04/22/2021 Left shoulder 3 views: No acute fractures.  There is spurs off the inferior aspect of the humeral head consistent with moderate arthritic changes.  Otherwise shoulder is well located.  Calcifications seen on the AP joint could represent loose bodies within the joint space but or not seen on any other views.    PMFS History: Patient Active Problem List   Diagnosis Date Noted   Morbid obesity ([place]) 03/08/2015   GERD (gastroesophageal reflux disease) 09/22/2013   Seasonal and perennial allergic rhinitis 07/16/2013   Dyspnea 12/19/2012   Cough 10/22/2012   Joint ache    Fatigue    Psoriasis    Edema 11/04/2011   Hypothyroid 11/04/2011   SVT (supraventricular tachycardia) ([place]) 10/01/2011   Hyperlipidemia 10/01/2011   Past Medical History:  Diagnosis Date   COPD (chronic obstructive pulmonary disease) ([place])    Dysplastic nevus 02/13/2020   right inguinal crease, mild  atypia    Fatigue    Hyperlipidemia    Hypothyroidism    Joint ache    Lower extremity edema    Psoriasis    SVT (supraventricular tachycardia) ([place])    a. 10/2011 Echo: EF 60-65%, Gr 2 DD.    Family History  Problem Relation Age of Onset   Heart disease Mother        MV repair; ICD   Hypertension Mother    Kidney disease Mother    Skin cancer Maternal Grandmother    Stroke Father    Diabetes Father    Hypertension Father    Diabetes Sister     Past Surgical History:  Procedure Laterality Date   ANKLE SURGERY     LUMBAR [place]  SURGERY     tonsillectomy     Social History   Occupational History    Comment: Corporate investment banker  Tobacco Use   Smoking status: Former    Packs/day: 1.00    Years: 16.00    Pack years: 16.00    Types: Cigarettes    Quit date: 07/13/1997    Years since quitting: 23.7   Smokeless tobacco: Never  Substance and Sexual Activity   Alcohol use: Yes    Alcohol/week: 7.0 standard drinks    Types: 7 Glasses of wine per week    Comment: 1 glass of wine daily   Drug use: Not on file   Sexual activity: Not on file

## 2021-04-23 NOTE — Addendum Note (Signed)
Addended by: Elvin So L on: 04/23/2021 02:00 PM   Modules accepted: Orders

## 2021-04-24 ENCOUNTER — Telehealth: Payer: Self-pay | Admitting: Physical Medicine and Rehabilitation

## 2021-04-24 NOTE — Telephone Encounter (Signed)
Patient called. She would like to schedule with Dr. Ernestina Patches. MK#[phone]

## 2021-04-25 ENCOUNTER — Telehealth: Payer: Self-pay | Admitting: Physical Medicine and Rehabilitation

## 2021-04-25 NOTE — Telephone Encounter (Signed)
Pt returned call

## 2021-04-27 LAB — COMPREHENSIVE METABOLIC PANEL
ALT: 14 IU/L (ref 0–32)
AST: 20 IU/L (ref 0–40)
Albumin/Globulin Ratio: 2.3 — ABNORMAL HIGH (ref 1.2–2.2)
Albumin: 4.6 g/dL (ref 3.8–4.9)
Alkaline Phosphatase: 70 IU/L (ref 44–121)
BUN/Creatinine Ratio: 21 (ref 9–23)
BUN: 24 mg/dL (ref 6–24)
Bilirubin Total: 0.4 mg/dL (ref 0.0–1.2)
CO2: 26 mmol/L (ref 20–29)
Calcium: 9.5 mg/dL (ref 8.7–10.2)
Chloride: 103 mmol/L (ref 96–106)
Creatinine, Ser: 1.15 mg/dL — ABNORMAL HIGH (ref 0.57–1.00)
Globulin, Total: 2 g/dL (ref 1.5–4.5)
Glucose: 111 mg/dL — ABNORMAL HIGH (ref 70–99)
Potassium: 5 mmol/L (ref 3.5–5.2)
Sodium: 143 mmol/L (ref 134–144)
Total Protein: 6.6 g/dL (ref 6.0–8.5)
eGFR: 56 mL/min/{1.73_m2} — ABNORMAL LOW (ref >59)

## 2021-04-27 LAB — QUANTIFERON-TB GOLD PLUS
QuantiFERON Mitogen Value: 7.34 IU/mL
QuantiFERON Nil Value: 0.08 IU/mL
QuantiFERON TB1 Ag Value: 0.05 IU/mL
QuantiFERON TB2 Ag Value: 0.03 IU/mL
QuantiFERON-TB Gold Plus: NEGATIVE

## 2021-04-30 ENCOUNTER — Telehealth: Payer: Self-pay

## 2021-04-30 DIAGNOSIS — L4050 Arthropathic psoriasis, unspecified: Secondary | ICD-10-CM

## 2021-04-30 MED ORDER — TALTZ 80 MG/ML ~~LOC~~ SOAJ: 80.0000 mg | SUBCUTANEOUS | 5 refills | Status: DC

## 2021-04-30 NOTE — Telephone Encounter (Signed)
Advised pt of lab results.  Sent 6 rfs of [place] to [facility].  Advised we would sen pts labs to her PCP./sh

## 2021-05-14 ENCOUNTER — Other Ambulatory Visit: Payer: Self-pay

## 2021-05-14 ENCOUNTER — Ambulatory Visit: Payer: Self-pay

## 2021-05-14 ENCOUNTER — Ambulatory Visit (INDEPENDENT_AMBULATORY_CARE_PROVIDER_SITE_OTHER): Payer: BC Managed Care – PPO | Admitting: Physical Medicine and Rehabilitation

## 2021-05-14 ENCOUNTER — Encounter: Payer: Self-pay | Admitting: Physical Medicine and Rehabilitation

## 2021-05-14 DIAGNOSIS — M25512 Pain in left shoulder: Secondary | ICD-10-CM | POA: Diagnosis not present

## 2021-05-14 DIAGNOSIS — G8929 Other chronic pain: Secondary | ICD-10-CM | POA: Diagnosis not present

## 2021-05-14 NOTE — Progress Notes (Signed)
Pt state left shoulder pain. Pt state any movement with her left arm makes the pain worse. Pt state she takes over the counter pain meds and uses heating to hel[p ease her pain.   Numeric Pain Rating Scale and Functional Assessment Average Pain 6    In the last MONTH (on 0-10 scale) has pain interfered with the following?  1. General activity like being  able to carry out your everyday physical activities such as walking, climbing stairs, carrying groceries, or moving a chair?  Rating(8)   -BT, -Dye Allergies.

## 2021-05-14 NOTE — Progress Notes (Signed)
   JANEIL SCHEXNAYDER - 55 y.o. female MRN [id number]  Date of birth: [birth year]  Office Visit Note: Visit Date: 05/14/2021 PCP: Jilda Panda, MD Referred by: Jilda Panda, MD  Subjective: Chief Complaint  Patient presents with   Left Shoulder - Pain   HPI:  EARMA NICOLAOU is a 55 y.o. female who comes in today at the request of Dr. Jean Rosenthal for planned Left anesthetic glenohumeral arthrogram with fluoroscopic guidance.  The patient has failed conservative care including home exercise, medications, time and activity modification.  This injection will be diagnostic and hopefully therapeutic.  Please see requesting physician notes for further details and justification.  ROS Otherwise per HPI.  Assessment & Plan: Visit Diagnoses:    ICD-10-CM   1. Chronic left shoulder pain  M25.512 XR C-ARM NO REPORT   G89.29 Large Joint Inj: L glenohumeral      Plan: No additional findings.   Meds & Orders: No orders of the defined types were placed in this encounter.   Orders Placed This Encounter  Procedures   Large Joint Inj: L glenohumeral   XR C-ARM NO REPORT    Follow-up: Return for Anderson Malta, MD as scheduled.   Procedures: Large Joint Inj: L glenohumeral on 05/14/2021 2:58 PM Indications: pain and diagnostic evaluation Details: 22 G 3.5 in needle, fluoroscopy-guided anteromedial approach  Arthrogram: No  Medications: 3 mL bupivacaine 0.5 %; 40 mg triamcinolone acetonide 40 MG/ML; 5 mL bupivacaine 0.25 % Outcome: tolerated well, no immediate complications  There was excellent flow of contrast producing a partial arthrogram of the glenohumeral joint. The patient did have relief of symptoms during the anesthetic phase of the injection. Procedure, treatment alternatives, risks and benefits explained, specific risks discussed. Consent was given by the patient. Immediately prior to procedure a time out was called to verify the correct patient, procedure, equipment, support  staff and site/side marked as required. Patient was prepped and draped in the usual sterile fashion.         Clinical History: No specialty comments available.     Objective:  VS:  HT:    WT:   BMI:     BP:   HR: bpm  TEMP: ( )  RESP:  Physical Exam   Imaging: No results found.

## 2021-05-17 MED ORDER — BUPIVACAINE HCL 0.25 % IJ SOLN
5.0000 mL | INTRAMUSCULAR | Status: AC | PRN
  Administered 2021-05-14: 5 mL via INTRA_ARTICULAR

## 2021-05-17 MED ORDER — BUPIVACAINE HCL 0.5 % IJ SOLN
3.0000 mL | INTRAMUSCULAR | Status: AC | PRN
  Administered 2021-05-14: 3 mL via INTRA_ARTICULAR

## 2021-05-17 MED ORDER — TRIAMCINOLONE ACETONIDE 40 MG/ML IJ SUSP
40.0000 mg | INTRAMUSCULAR | Status: AC | PRN
  Administered 2021-05-14: 40 mg via INTRA_ARTICULAR

## 2021-05-19 ENCOUNTER — Other Ambulatory Visit: Payer: Self-pay

## 2021-05-19 ENCOUNTER — Ambulatory Visit (INDEPENDENT_AMBULATORY_CARE_PROVIDER_SITE_OTHER): Payer: BC Managed Care – PPO | Admitting: Orthopedic Surgery

## 2021-05-19 DIAGNOSIS — M25512 Pain in left shoulder: Secondary | ICD-10-CM

## 2021-05-22 ENCOUNTER — Telehealth: Payer: Self-pay

## 2021-05-22 MED ORDER — PREDNISONE 50 MG PO TABS: ORAL_TABLET | ORAL | 0 refills | Status: DC

## 2021-05-22 NOTE — Progress Notes (Signed)
Phone call to patient to review instructions for 13 hr prep for Arthrogram w/ contrast on 05/28/21 at 2:45 PM. Prescription called into [facility]. Pt aware and verbalized understanding of instructions. Prescription: Pt to take 50 mg of prednisone on 05/28/21 at 1:45 AM, 50 mg of prednisone on 05/28/21 at 7:45 AM , and 50 mg of prednisone on 05/28/21 at 1:45 PM. Pt is also to take 50 mg of benadryl on 05/28/21 at 1:45 PM. Please call [phone] with any questions.    Benadryl was not called in as a separate prescription as the patient states she has benadryl at home and understands when to take it. Advised pt to not drive the day of taking benadryl as it may cause drowsiness. Pt verbalized understanding.

## 2021-05-25 ENCOUNTER — Encounter: Payer: Self-pay | Admitting: Orthopedic Surgery

## 2021-05-25 NOTE — Progress Notes (Signed)
Office Visit Note   Patient: Cynthia Obrien           Date of Birth: 01/25/1966           MRN: [id number] Visit Date: 05/19/2021 Requested by: Jilda Panda, MD [address] [place],  [place] [address] PCP: Jilda Panda, MD  Subjective: Chief Complaint  Patient presents with   Left Shoulder - Follow-up    HPI: Patient presents for evaluation of left shoulder pain.  Reports pain level 3 out of 10.  Having a lot of pain in the biceps region.  Had an injection which helped some.  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  Localizes pain in the biceps region with that problem.  No prior shoulder surgeries.  Denies any history of injury.  Reports some mechanical symptoms.  No discrete weakness by history              ROS: All systems reviewed are negative as they relate to the chief complaint within the history of present illness.  Patient denies  fevers or chills.   Assessment & Plan: Visit Diagnoses:  1. Acute pain of left shoulder     Plan: Impression is left shoulder pain which could be labral problem or biceps tendon problem.  Cortisone did help.  Symptoms ongoing now for more than a couple of months.  Does not have much restricted range of motion so frozen shoulder unlikely.  Rotator cuff strength is reasonable.  She could have subluxating biceps tendon or some type of biceps tendinitis or SLAP tear.  She has failed conservative measures.  Need MRI scan left shoulder to evaluate biceps tendon.  Follow-up after that study.  Follow-Up Instructions: No follow-ups on file.   Orders:  Orders Placed This Encounter  Procedures   MR Shoulder Left w/ contrast   Arthrogram   No orders of the defined types were placed in this encounter.     Procedures: No procedures performed   Clinical Data: No additional findings.  Objective: Vital Signs: There were no vitals taken for this visit.  Physical Exam:   Constitutional: Patient appears well-developed HEENT:  Head:  Normocephalic Eyes:EOM are normal Neck: Normal range of motion Cardiovascular: Normal rate Pulmonary/chest: Effort normal Neurologic: Patient is alert Skin: Skin is warm Psychiatric: Patient has normal mood and affect   Ortho Exam: Ortho exam demonstrates good cervical spine range of motion.  5 out of 5 grip EPL FPL interosseous wrist flexion wrist extension bicep triceps and deltoid strength.  Radial pulses intact.  No masses lymphadenopathy or skin changes noted in that shoulder girdle region.  She does have positive O'Brien's testing but no discrete AC joint tenderness.  Does have discrete bicipital groove tenderness on the left but not the right.  No other limitation of external rotation asymmetrically left versus right.  Specialty Comments:  No specialty comments available.  Imaging: No results found.   PMFS History: Patient Active Problem List   Diagnosis Date Noted   Morbid obesity ([place]) 03/08/2015   GERD (gastroesophageal reflux disease) 09/22/2013   Seasonal and perennial allergic rhinitis 07/16/2013   Dyspnea 12/19/2012   Cough 10/22/2012   Joint ache    Fatigue    Psoriasis    Edema 11/04/2011   Hypothyroid 11/04/2011   SVT (supraventricular tachycardia) ([place]) 10/01/2011   Hyperlipidemia 10/01/2011   Past Medical History:  Diagnosis Date   COPD (chronic obstructive pulmonary disease) ([place])    Dysplastic nevus 02/13/2020   right inguinal crease, mild  atypia    Fatigue    Hyperlipidemia    Hypothyroidism    Joint ache    Lower extremity edema    Psoriasis    SVT (supraventricular tachycardia) ([place])    a. 10/2011 Echo: EF 60-65%, Gr 2 DD.    Family History  Problem Relation Age of Onset   Heart disease Mother        MV repair; ICD   Hypertension Mother    Kidney disease Mother    Skin cancer Maternal Grandmother    Stroke Father    Diabetes Father    Hypertension Father    Diabetes Sister     Past Surgical History:  Procedure Laterality Date    ANKLE SURGERY     LUMBAR [place] SURGERY     tonsillectomy     Social History   Occupational History    Comment: Corporate investment banker  Tobacco Use   Smoking status: Former    Packs/day: 1.00    Years: 16.00    Pack years: 16.00    Types: Cigarettes    Quit date: 07/13/1997    Years since quitting: 23.8   Smokeless tobacco: Never  Substance and Sexual Activity   Alcohol use: Yes    Alcohol/week: 7.0 standard drinks    Types: 7 Glasses of wine per week    Comment: 1 glass of wine daily   Drug use: Not on file   Sexual activity: Not on file

## 2021-05-28 ENCOUNTER — Ambulatory Visit
Admission: RE | Admit: 2021-05-28 | Discharge: 2021-05-28 | Disposition: A | Discharge status: Home / Self Care | Payer: BC Managed Care – PPO | Source: Ambulatory Visit | Attending: Orthopedic Surgery | Admitting: Orthopedic Surgery

## 2021-05-28 DIAGNOSIS — M25512 Pain in left shoulder: Secondary | ICD-10-CM

## 2021-05-28 MED ORDER — IOPAMIDOL (ISOVUE-M 200) INJECTION 41%
12.0000 mL | Freq: Once | INTRAMUSCULAR | Status: AC
  Administered 2021-05-28: 12 mL via INTRA_ARTICULAR

## 2021-06-11 ENCOUNTER — Ambulatory Visit (INDEPENDENT_AMBULATORY_CARE_PROVIDER_SITE_OTHER): Payer: BC Managed Care – PPO | Admitting: Orthopedic Surgery

## 2021-06-11 ENCOUNTER — Other Ambulatory Visit: Payer: Self-pay

## 2021-06-11 ENCOUNTER — Encounter: Payer: Self-pay | Admitting: Orthopedic Surgery

## 2021-06-11 DIAGNOSIS — M19012 Primary osteoarthritis, left shoulder: Secondary | ICD-10-CM | POA: Diagnosis not present

## 2021-06-11 NOTE — Progress Notes (Signed)
Office Visit Note   Patient: Cynthia Obrien           Date of Birth: 03-27-1966           MRN: [id number] Visit Date: 06/11/2021 Requested by: Jilda Panda, MD [address] [place],  [place] [address] PCP: Jilda Panda, MD  Subjective: Chief Complaint  Patient presents with   Other    Scan review    HPI: The knee is a 55 year old patient with left shoulder pain.  Reports a constant ache in the shoulder but also pain which radiates into the biceps region.  She had cortisone injection about 4 weeks ago which helped for several weeks.  Her pain did improve except for the pain radiating into her biceps region.  MRI scan shows partial-thickness rotator cuff pathology along with arthritis and biceps tendon tendinitis.  She has not been able to resume upper extremity exercises.  Takes Tylenol for symptoms.  She does have psoriatic arthritis.  Would like to be feeling good before scuba trip in February.              ROS: All systems reviewed are negative as they relate to the chief complaint within the history of present illness.  Patient denies  fevers or chills.   Assessment & Plan: Visit Diagnoses:  1. Primary osteoarthritis of left shoulder     Plan: Impression is left shoulder pain.  Plan is after long discussion about risk and benefits of surgery she would like to get arthroscopy and biceps tenodesis.  I told her we could not really address any of the arthritic symptoms in her shoulder but she has had that for a while and she feels like these other symptoms are newer.  I think it is possible that we could get 50 to 60% relief of her shoulder pain with the biceps tenodesis.  She may need further surgery in the future.  Patient understands risk and benefits.  All questions answered.  Follow-Up Instructions: No follow-ups on file.   Orders:  No orders of the defined types were placed in this encounter.  No orders of the defined types were placed in this encounter.      Procedures: No procedures performed   Clinical Data: No additional findings.  Objective: Vital Signs: There were no vitals taken for this visit.  Physical Exam:   Constitutional: Patient appears well-developed HEENT:  Head: Normocephalic Eyes:EOM are normal Neck: Normal range of motion Cardiovascular: Normal rate Pulmonary/chest: Effort normal Neurologic: Patient is alert Skin: Skin is warm Psychiatric: Patient has normal mood and affect   Ortho Exam: Ortho exam demonstrates full active and passive range of motion of that left shoulder.  She has pretty good rotator cuff strength instrument supra and subscap muscle testing.  No coarse grinding or crepitus with internal or external rotation of the left arm at 90 degrees of abduction.  No discrete AC joint tenderness is present.  Positive O'Brien's testing positive speeds testing on the left none on the right.  Cervical spine range of motion is full.  Specialty Comments:  No specialty comments available.  Imaging: No results found.   PMFS History: Patient Active Problem List   Diagnosis Date Noted   Morbid obesity ([place]) 03/08/2015   GERD (gastroesophageal reflux disease) 09/22/2013   Seasonal and perennial allergic rhinitis 07/16/2013   Dyspnea 12/19/2012   Cough 10/22/2012   Joint ache    Fatigue    Psoriasis    Edema 11/04/2011   Hypothyroid 11/04/2011  SVT (supraventricular tachycardia) ([place]) 10/01/2011   Hyperlipidemia 10/01/2011   Past Medical History:  Diagnosis Date   COPD (chronic obstructive pulmonary disease) ([place])    Dysplastic nevus 02/13/2020   right inguinal crease, mild atypia    Fatigue    Hyperlipidemia    Hypothyroidism    Joint ache    Lower extremity edema    Psoriasis    SVT (supraventricular tachycardia) ([place])    a. 10/2011 Echo: EF 60-65%, Gr 2 DD.    Family History  Problem Relation Age of Onset   Heart disease Mother        MV repair; ICD   Hypertension Mother    Kidney disease  Mother    Skin cancer Maternal Grandmother    Stroke Father    Diabetes Father    Hypertension Father    Diabetes Sister     Past Surgical History:  Procedure Laterality Date   ANKLE SURGERY     LUMBAR [place] SURGERY     tonsillectomy     Social History   Occupational History    Comment: Corporate investment banker  Tobacco Use   Smoking status: Former    Packs/day: 1.00    Years: 16.00    Pack years: 16.00    Types: Cigarettes    Quit date: 07/13/1997    Years since quitting: 23.9   Smokeless tobacco: Never  Substance and Sexual Activity   Alcohol use: Yes    Alcohol/week: 7.0 standard drinks    Types: 7 Glasses of wine per week    Comment: 1 glass of wine daily   Drug use: Not on file   Sexual activity: Not on file

## 2021-06-12 ENCOUNTER — Encounter: Payer: Self-pay | Admitting: Dermatology

## 2021-06-23 ENCOUNTER — Encounter (HOSPITAL_COMMUNITY): Payer: Self-pay | Admitting: Orthopedic Surgery

## 2021-06-23 NOTE — Progress Notes (Signed)
DUE TO COVID-19 ONLY ONE VISITOR IS ALLOWED TO COME WITH YOU AND STAY IN THE WAITING ROOM ONLY DURING PRE OP AND PROCEDURE DAY OF SURGERY.   PCP - Dr Jilda Panda Cardiologist - Dr Kirk Ruths  Chest x-ray - n/a EKG - 10/07/20 Stress Test - 08/30/15 ECHO - 03/22/15 Cardiac Cath - n/a  ICD Pacemaker/Loop - na  Sleep Study -  n/a CPAP - none  ERAS: Clear liquids til 11:30 AM DOS  Anesthesia review: Yes  STOP now taking any Aspirin (unless otherwise instructed by your surgeon), Aleve, Naproxen, Ibuprofen, Motrin, Advil, Goody's, BC's, all herbal medications, fish oil, and all vitamins.   Coronavirus Screening Covid test n/a Ambulatory Surgery  Do you have any of the following symptoms:  Cough yes/no: No Fever (>100.7F)  yes/no: No Runny nose yes/no: No Sore throat yes/no: No Difficulty breathing/shortness of breath  yes/no: No  Have you traveled in the last 14 days and where? yes/no: No  Patient verbalized understanding of instructions that were given via phone.

## 2021-06-23 NOTE — Anesthesia Preprocedure Evaluation (Addendum)
Anesthesia Evaluation  Patient identified by MRN, date of birth, ID band Patient awake    Reviewed: Allergy & Precautions, NPO status , Patient's Chart, lab work & pertinent test results  History of Anesthesia Complications Negative for: history of anesthetic complications  Airway Mallampati: III  TM Distance: >3 FB Neck ROM: Full    Dental no notable dental hx. (+) Dental Advisory Given   Pulmonary COPD, former smoker,    Pulmonary exam normal        Cardiovascular hypertension, Pt. on medications Normal cardiovascular exam  Nuclear stress test 08/30/15: ? The left ventricular ejection fraction is normal (55-65%). ? Nuclear stress EF: 64%. ? This is a low risk study. ? The study is normal. ? Baseline nonspecific T wave changes inferolaterally with no change during infusion.   Echo 03/22/15: Study Conclusions  - Left ventricle: The cavity size was normal. Systolic function was  normal. The estimated ejection fraction was in the range of 60%  to 65%. Wall motion was normal; there were no regional wall  motion abnormalities. Left ventricular diastolic function  parameters were normal.  - Left atrium: The atrium was normal in size.  - Right ventricle: Systolic function was normal.  - Pulmonary arteries: Systolic pressure was within the normal  range.  Impressions:  - Normal study. Challenging image quality secondary to body  habitus.    Neuro/Psych negative neurological ROS     GI/Hepatic Neg liver ROS, GERD  ,  Endo/Other  Hypothyroidism Morbid obesity  Renal/GU negative Renal ROS     Musculoskeletal negative musculoskeletal ROS (+)   Abdominal   Peds  Hematology negative hematology ROS (+)   Anesthesia Other Findings   Reproductive/Obstetrics                           Anesthesia Physical Anesthesia Plan  ASA: 3  Anesthesia Plan: General   Post-op Pain  Management: Regional block, Celebrex PO (pre-op) and Tylenol PO (pre-op)   Induction: Intravenous  PONV Risk Score and Plan: 3 and Ondansetron, Dexamethasone and Midazolam  Airway Management Planned: Oral ETT  Additional Equipment:   Intra-op Plan:   Post-operative Plan: Extubation in OR  Informed Consent: I have reviewed the patients History and Physical, chart, labs and discussed the procedure including the risks, benefits and alternatives for the proposed anesthesia with the patient or authorized representative who has indicated his/her understanding and acceptance.     Dental advisory given  Plan Discussed with: Anesthesiologist and CRNA  Anesthesia Plan Comments: (PAT note written 06/23/2021 by Myra Gianotti, PA-C. )      Anesthesia Quick Evaluation

## 2021-06-23 NOTE — Progress Notes (Signed)
Anesthesia Chart Review: SAME DAY WORK-UP   Case: [id number] Date/Time: 06/24/21 1431   Procedure: LEFT ARTHROSCOPY SHOULDER, DEBRIDEMENT, BICEPS TENODESIS (Left: Shoulder)   Anesthesia type: General   Pre-op diagnosis: left shoulder biceps tendonitis   Location: [facility] OR ROOM 06 / [facility]   Surgeons: Meredith Pel, MD       DISCUSSION: Patient is a 55 year old female scheduled for the above procedure.  History includes former smoker, COPD, hypothyroidism, HLD, SVT (09/2010), LE edema, psoriasis/psoriatic arthritis, spinal surgery (L5-S1 microdiscectomy 10/29/05).   Last cardiology visit with Dr. Stanford Breed on 10/07/2020. No recent recurrence of SVT. Continue Cardizem (did not tolerate b-blocker other than Inderal). If more frequent SVT then could refer to EP for consideration of ablation.  Prescribed Lasix 20 mg daily as needed for occasional lower extremity edema.  She is a same-day work-up.  She is for labs and anesthesia team evaluation on the day of surgery.   VS:  BP Readings from Last 3 Encounters:  10/07/20 110/68  10/05/19 104/72  09/14/18 116/64   Pulse Readings from Last 3 Encounters:  10/07/20 60  10/05/19 (!) 57  09/14/18 (!) 55     PROVIDERS: Jilda Panda, MD is PCP  Kirk Ruths, MD is cardiologist Ephriam Jenkins, MD is rheumatologist [facility], see [facility] Everywhere) Delrae Rend, MD is endocrinologist   LABS: For day of surgery as indicated. As of 04/22/21, Cr 1.15, AST 20, ALT 14. 12/16/20 CBC ([facility]) showed H/H WBC 6.8j, H/H 14.1/42.6, PLT 213.   Spirometry > 8 years ago.   IMAGES: MRI Left shoulder 05/28/21: IMPRESSION: 1. Mild tendinosis of the supraspinatus tendon with a partial-thickness articular surface tear with an interstitial component. 2. Mild tendinosis of the infraspinatus tendon. 3. Moderate tendinosis of the intra-articular portion of the long head of the biceps tendon. 4. High-grade partial-thickness cartilage loss of the  glenohumeral ligament.      EKG: 10/07/2020: Normal sinus rhythm.  Possible left atrial enlargement.   CV: Nuclear stress test 08/30/15: The left ventricular ejection fraction is normal (55-65%). Nuclear stress EF: 64%. This is a low risk study. The study is normal. Baseline nonspecific T wave changes inferolaterally with no change during infusion.    Echo 03/22/15: Study Conclusions  - Left ventricle: The cavity size was normal. Systolic function was    normal. The estimated ejection fraction was in the range of 60%    to 65%. Wall motion was normal; there were no regional wall    motion abnormalities. Left ventricular diastolic function    parameters were normal.  - Left atrium: The atrium was normal in size.  - Right ventricle: Systolic function was normal.  - Pulmonary arteries: Systolic pressure was within the normal    range.  Impressions:  - Normal study. Challenging image quality secondary to body    habitus.   Past Medical History:  Diagnosis Date   COPD (chronic obstructive pulmonary disease) ([place])    Dysplastic nevus 02/13/2020   right inguinal crease, mild atypia    Fatigue    Hyperlipidemia    Hypothyroidism    Joint ache    Lower extremity edema    Psoriasis    SVT (supraventricular tachycardia) ([place])    a. 10/2011 Echo: EF 60-65%, Gr 2 DD.    Past Surgical History:  Procedure Laterality Date   ANKLE SURGERY     LUMBAR DISC SURGERY     tonsillectomy      MEDICATIONS: No current facility-administered medications for  this encounter.    Azelaic Acid (FINACEA) 15 % FOAM   CALCIUM PO   CALCIUM-VITAMIN D PO   diltiazem (CARDIZEM CD) 120 MG 24 hr capsule   Ixekizumab (TALTZ) 80 MG/ML SOAJ   levothyroxine (SYNTHROID) 125 MCG tablet   Multiple Vitamins-Minerals (BARIATRIC MULTIVITAMINS/IRON PO)   Potassium 99 MG TABS   sertraline (ZOLOFT) 50 MG tablet   predniSONE (DELTASONE) 50 MG tablet  Prednisone is listed as not taking.   Myra Gianotti,  PA-C Surgical Short Stay/Anesthesiology [facility] Phone [phone] [facility] Phone [phone] 06/23/2021 1:01 PM

## 2021-06-24 ENCOUNTER — Ambulatory Visit (HOSPITAL_COMMUNITY): Payer: BC Managed Care – PPO | Admitting: Vascular Surgery

## 2021-06-24 ENCOUNTER — Ambulatory Visit (HOSPITAL_COMMUNITY)
Admission: RE | Admit: 2021-06-24 | Discharge: 2021-06-24 | Disposition: A | Discharge status: Home / Self Care | Payer: BC Managed Care – PPO | Attending: Orthopedic Surgery | Admitting: Orthopedic Surgery

## 2021-06-24 ENCOUNTER — Encounter (HOSPITAL_COMMUNITY)
Admission: RE | Disposition: A | Discharge status: Home / Self Care | Payer: Self-pay | Source: Home / Self Care | Attending: Orthopedic Surgery

## 2021-06-24 ENCOUNTER — Encounter (HOSPITAL_COMMUNITY): Payer: Self-pay | Admitting: Orthopedic Surgery

## 2021-06-24 DIAGNOSIS — E039 Hypothyroidism, unspecified: Secondary | ICD-10-CM | POA: Diagnosis not present

## 2021-06-24 DIAGNOSIS — Z79899 Other long term (current) drug therapy: Secondary | ICD-10-CM | POA: Insufficient documentation

## 2021-06-24 DIAGNOSIS — M7522 Bicipital tendinitis, left shoulder: Secondary | ICD-10-CM | POA: Diagnosis not present

## 2021-06-24 DIAGNOSIS — K219 Gastro-esophageal reflux disease without esophagitis: Secondary | ICD-10-CM | POA: Insufficient documentation

## 2021-06-24 DIAGNOSIS — L4050 Arthropathic psoriasis, unspecified: Secondary | ICD-10-CM | POA: Diagnosis not present

## 2021-06-24 DIAGNOSIS — S43432A Superior glenoid labrum lesion of left shoulder, initial encounter: Secondary | ICD-10-CM

## 2021-06-24 DIAGNOSIS — M94212 Chondromalacia, left shoulder: Secondary | ICD-10-CM | POA: Insufficient documentation

## 2021-06-24 DIAGNOSIS — I10 Essential (primary) hypertension: Secondary | ICD-10-CM | POA: Diagnosis not present

## 2021-06-24 DIAGNOSIS — J449 Chronic obstructive pulmonary disease, unspecified: Secondary | ICD-10-CM | POA: Diagnosis not present

## 2021-06-24 DIAGNOSIS — Z01818 Encounter for other preprocedural examination: Secondary | ICD-10-CM

## 2021-06-24 DIAGNOSIS — Z87891 Personal history of nicotine dependence: Secondary | ICD-10-CM | POA: Insufficient documentation

## 2021-06-24 DIAGNOSIS — M25512 Pain in left shoulder: Secondary | ICD-10-CM | POA: Diagnosis present

## 2021-06-24 HISTORY — DX: Unspecified osteoarthritis, unspecified site: M19.90

## 2021-06-24 HISTORY — DX: Respiratory tuberculosis unspecified: A15.9

## 2021-06-24 HISTORY — PX: SHOULDER ARTHROSCOPY: SHX128

## 2021-06-24 LAB — BASIC METABOLIC PANEL
Anion gap: 10 (ref 5–15)
BUN: 18 mg/dL (ref 6–20)
CO2: 23 mmol/L (ref 22–32)
Calcium: 9 mg/dL (ref 8.9–10.3)
Chloride: 106 mmol/L (ref 98–111)
Creatinine, Ser: 1.02 mg/dL — ABNORMAL HIGH (ref 0.44–1.00)
GFR, Estimated: >60 mL/min (ref >60)
Glucose, Bld: 78 mg/dL (ref 70–99)
Potassium: 4.3 mmol/L (ref 3.5–5.1)
Sodium: 139 mmol/L (ref 135–145)

## 2021-06-24 LAB — CBC
HCT: 41.2 % (ref 36.0–46.0)
Hemoglobin: 13.2 g/dL (ref 12.0–15.0)
MCH: 32.7 pg (ref 26.0–34.0)
MCHC: 32 g/dL (ref 30.0–36.0)
MCV: 102 fL — ABNORMAL HIGH (ref 80.0–100.0)
Platelets: 183 10*3/uL (ref 150–400)
RBC: 4.04 MIL/uL (ref 3.87–5.11)
RDW: 14.3 % (ref 11.5–15.5)
WBC: 4.2 10*3/uL (ref 4.0–10.5)
nRBC: 0 % (ref 0.0–0.2)

## 2021-06-24 LAB — SURGICAL PCR SCREEN
MRSA, PCR: NEGATIVE
Staphylococcus aureus: NEGATIVE

## 2021-06-24 LAB — POCT PREGNANCY, URINE: Preg Test, Ur: NEGATIVE

## 2021-06-24 SURGERY — ARTHROSCOPY, SHOULDER
Anesthesia: General | Site: Shoulder | Laterality: Left

## 2021-06-24 MED ORDER — POVIDONE-IODINE 10 % EX SWAB: 2.0000 "application " | Freq: Once | CUTANEOUS | Status: DC

## 2021-06-24 MED ORDER — VANCOMYCIN HCL IN DEXTROSE 1-5 GM/200ML-% IV SOLN
INTRAVENOUS | Status: AC
  Filled 2021-06-24: qty 200

## 2021-06-24 MED ORDER — SODIUM CHLORIDE 0.9 % IR SOLN
Status: DC | PRN
  Administered 2021-06-24 (×4): 3000 mL

## 2021-06-24 MED ORDER — DEXAMETHASONE SODIUM PHOSPHATE 10 MG/ML IJ SOLN
INTRAMUSCULAR | Status: AC
  Filled 2021-06-24: qty 1

## 2021-06-24 MED ORDER — LACTATED RINGERS IV SOLN: INTRAVENOUS | Status: DC

## 2021-06-24 MED ORDER — MIDAZOLAM HCL 2 MG/2ML IJ SOLN
INTRAMUSCULAR | Status: DC | PRN
  Administered 2021-06-24: 2 mg via INTRAVENOUS

## 2021-06-24 MED ORDER — SUGAMMADEX SODIUM 200 MG/2ML IV SOLN
INTRAVENOUS | Status: DC | PRN
  Administered 2021-06-24: 300 mg via INTRAVENOUS

## 2021-06-24 MED ORDER — CHLORHEXIDINE GLUCONATE 0.12 % MT SOLN
15.0000 mL | OROMUCOSAL | Status: AC
  Filled 2021-06-24: qty 15

## 2021-06-24 MED ORDER — DEXAMETHASONE SODIUM PHOSPHATE 10 MG/ML IJ SOLN
INTRAMUSCULAR | Status: DC | PRN
  Administered 2021-06-24: 10 mg via INTRAVENOUS

## 2021-06-24 MED ORDER — ACETAMINOPHEN 500 MG PO TABS
ORAL_TABLET | ORAL | Status: AC
  Administered 2021-06-24: 1000 mg via ORAL
  Filled 2021-06-24: qty 2

## 2021-06-24 MED ORDER — LIDOCAINE 2% (20 MG/ML) 5 ML SYRINGE
INTRAMUSCULAR | Status: AC
  Filled 2021-06-24: qty 5

## 2021-06-24 MED ORDER — MIDAZOLAM HCL 2 MG/2ML IJ SOLN
INTRAMUSCULAR | Status: AC
  Administered 2021-06-24: 2 mg via INTRAVENOUS
  Filled 2021-06-24: qty 2

## 2021-06-24 MED ORDER — BUPIVACAINE HCL 0.25 % IJ SOLN
INTRAMUSCULAR | Status: DC | PRN
  Administered 2021-06-24: 5 mL

## 2021-06-24 MED ORDER — BUPIVACAINE HCL (PF) 0.25 % IJ SOLN
INTRAMUSCULAR | Status: AC
  Filled 2021-06-24: qty 30

## 2021-06-24 MED ORDER — KETOROLAC TROMETHAMINE 30 MG/ML IJ SOLN
INTRAMUSCULAR | Status: DC | PRN
  Administered 2021-06-24: 30 mg via INTRAMUSCULAR

## 2021-06-24 MED ORDER — ROCURONIUM BROMIDE 10 MG/ML (PF) SYRINGE
PREFILLED_SYRINGE | INTRAVENOUS | Status: DC | PRN
  Administered 2021-06-24: 100 mg via INTRAVENOUS
  Administered 2021-06-24: 30 mg via INTRAVENOUS

## 2021-06-24 MED ORDER — PROPOFOL 10 MG/ML IV BOLUS
INTRAVENOUS | Status: AC
  Filled 2021-06-24: qty 20

## 2021-06-24 MED ORDER — VANCOMYCIN HCL 1500 MG/300ML IV SOLN
1500.0000 mg | INTRAVENOUS | Status: AC
  Administered 2021-06-24: 1500 mg via INTRAVENOUS
  Filled 2021-06-24: qty 300

## 2021-06-24 MED ORDER — SUGAMMADEX SODIUM 500 MG/5ML IV SOLN
INTRAVENOUS | Status: AC
  Filled 2021-06-24: qty 5

## 2021-06-24 MED ORDER — FENTANYL CITRATE (PF) 100 MCG/2ML IJ SOLN: 25.0000 ug | INTRAMUSCULAR | Status: DC | PRN

## 2021-06-24 MED ORDER — MIDAZOLAM HCL 2 MG/2ML IJ SOLN
INTRAMUSCULAR | Status: AC
  Filled 2021-06-24: qty 2

## 2021-06-24 MED ORDER — FENTANYL CITRATE (PF) 100 MCG/2ML IJ SOLN: 100.0000 ug | Freq: Once | INTRAMUSCULAR | Status: AC

## 2021-06-24 MED ORDER — FENTANYL CITRATE (PF) 100 MCG/2ML IJ SOLN
INTRAMUSCULAR | Status: AC
  Administered 2021-06-24: 100 ug via INTRAVENOUS
  Filled 2021-06-24: qty 2

## 2021-06-24 MED ORDER — ROCURONIUM BROMIDE 10 MG/ML (PF) SYRINGE
PREFILLED_SYRINGE | INTRAVENOUS | Status: AC
  Filled 2021-06-24: qty 10

## 2021-06-24 MED ORDER — GLYCOPYRROLATE PF 0.2 MG/ML IJ SOSY
PREFILLED_SYRINGE | INTRAMUSCULAR | Status: DC | PRN
  Administered 2021-06-24: .2 mg via INTRAVENOUS

## 2021-06-24 MED ORDER — ONDANSETRON HCL 4 MG/2ML IJ SOLN
INTRAMUSCULAR | Status: DC | PRN
  Administered 2021-06-24: 4 mg via INTRAVENOUS

## 2021-06-24 MED ORDER — KETOROLAC TROMETHAMINE 30 MG/ML IJ SOLN
INTRAMUSCULAR | Status: AC
  Filled 2021-06-24: qty 1

## 2021-06-24 MED ORDER — OXYCODONE-ACETAMINOPHEN 5-325 MG PO TABS: 1.0000 | ORAL_TABLET | ORAL | 0 refills | Status: DC | PRN

## 2021-06-24 MED ORDER — AMISULPRIDE (ANTIEMETIC) 5 MG/2ML IV SOLN: 10.0000 mg | Freq: Once | INTRAVENOUS | Status: DC | PRN

## 2021-06-24 MED ORDER — PROPOFOL 10 MG/ML IV BOLUS
INTRAVENOUS | Status: DC | PRN
  Administered 2021-06-24: 180 mg via INTRAVENOUS

## 2021-06-24 MED ORDER — MUPIROCIN 2 % EX OINT
TOPICAL_OINTMENT | CUTANEOUS | Status: AC
  Administered 2021-06-24: 1 via TOPICAL
  Filled 2021-06-24: qty 22

## 2021-06-24 MED ORDER — BUPIVACAINE LIPOSOME 1.3 % IJ SUSP
INTRAMUSCULAR | Status: DC | PRN
  Administered 2021-06-24: 10 mL via PERINEURAL

## 2021-06-24 MED ORDER — MIDAZOLAM HCL 2 MG/2ML IJ SOLN: 2.0000 mg | Freq: Once | INTRAMUSCULAR | Status: AC

## 2021-06-24 MED ORDER — PROMETHAZINE HCL 25 MG/ML IJ SOLN: 6.2500 mg | INTRAMUSCULAR | Status: DC | PRN

## 2021-06-24 MED ORDER — PHENYLEPHRINE HCL-NACL 20-0.9 MG/250ML-% IV SOLN
INTRAVENOUS | Status: DC | PRN
  Administered 2021-06-24: 40 ug/min via INTRAVENOUS

## 2021-06-24 MED ORDER — CHLORHEXIDINE GLUCONATE 0.12 % MT SOLN
OROMUCOSAL | Status: AC
  Administered 2021-06-24: 15 mL via OROMUCOSAL
  Filled 2021-06-24: qty 15

## 2021-06-24 MED ORDER — METHOCARBAMOL 500 MG PO TABS: 500.0000 mg | ORAL_TABLET | Freq: Three times a day (TID) | ORAL | 0 refills | Status: DC | PRN

## 2021-06-24 MED ORDER — MUPIROCIN 2 % EX OINT: 1.0000 "application " | TOPICAL_OINTMENT | Freq: Two times a day (BID) | CUTANEOUS | Status: DC

## 2021-06-24 MED ORDER — ACETAMINOPHEN 500 MG PO TABS: 1000.0000 mg | ORAL_TABLET | Freq: Once | ORAL | Status: AC

## 2021-06-24 MED ORDER — FENTANYL CITRATE (PF) 250 MCG/5ML IJ SOLN
INTRAMUSCULAR | Status: DC | PRN
  Administered 2021-06-24: 50 ug via INTRAVENOUS

## 2021-06-24 MED ORDER — ONDANSETRON HCL 4 MG/2ML IJ SOLN
INTRAMUSCULAR | Status: AC
  Filled 2021-06-24: qty 2

## 2021-06-24 MED ORDER — FENTANYL CITRATE (PF) 250 MCG/5ML IJ SOLN
INTRAMUSCULAR | Status: AC
  Filled 2021-06-24: qty 5

## 2021-06-24 MED ORDER — POVIDONE-IODINE 7.5 % EX SOLN
Freq: Once | CUTANEOUS | Status: DC
  Filled 2021-06-24: qty 118

## 2021-06-24 MED ORDER — BUPIVACAINE HCL (PF) 0.5 % IJ SOLN
INTRAMUSCULAR | Status: DC | PRN
  Administered 2021-06-24: 15 mL via PERINEURAL

## 2021-06-24 MED ORDER — GLYCOPYRROLATE PF 0.2 MG/ML IJ SOSY
PREFILLED_SYRINGE | INTRAMUSCULAR | Status: AC
  Filled 2021-06-24: qty 1

## 2021-06-24 SURGICAL SUPPLY — 64 items
AID PSTN UNV HD RSTRNT DISP (MISCELLANEOUS) ×1
ALCOHOL 70% 16 OZ (MISCELLANEOUS) ×3 IMPLANT
ANCHOR SUT 1.8 FBRTK KNTLS 2SU (Anchor) ×2 IMPLANT
BAG COUNTER SPONGE SURGICOUNT (BAG) ×2 IMPLANT
BAG SPNG CNTER NS LX DISP (BAG) ×1
BAG SURGICOUNT SPONGE COUNTING (BAG) ×1
BLADE EXCALIBUR 4.0MM X 13CM (MISCELLANEOUS) ×1
BLADE EXCALIBUR 4.0X13 (MISCELLANEOUS) ×2 IMPLANT
BLADE SURG 11 STRL SS (BLADE) ×3 IMPLANT
CLOSURE WOUND 1/2 X4 (GAUZE/BANDAGES/DRESSINGS) ×1
COOLER ICEMAN CLASSIC (MISCELLANEOUS) ×2 IMPLANT
DRAPE IMP U-DRAPE 54X76 (DRAPES) ×3 IMPLANT
DRAPE INCISE IOBAN 66X45 STRL (DRAPES) ×3 IMPLANT
DRAPE STERI 35X30 U-POUCH (DRAPES) ×3 IMPLANT
DRAPE U-SHAPE 47X51 STRL (DRAPES) ×6 IMPLANT
DRSG AQUACEL AG ADV 3.5X 4 (GAUZE/BANDAGES/DRESSINGS) ×1 IMPLANT
DRSG TEGADERM 4X4.75 (GAUZE/BANDAGES/DRESSINGS) ×6 IMPLANT
DURAPREP 26ML APPLICATOR (WOUND CARE) ×3 IMPLANT
DW OUTFLOW CASSETTE/TUBE SET (MISCELLANEOUS) ×3 IMPLANT
ELECT REM PT RETURN 9FT ADLT (ELECTROSURGICAL) ×3
ELECTRODE REM PT RTRN 9FT ADLT (ELECTROSURGICAL) ×1 IMPLANT
GAUZE SPONGE 4X4 12PLY STRL (GAUZE/BANDAGES/DRESSINGS) ×2 IMPLANT
GAUZE XEROFORM 1X8 LF (GAUZE/BANDAGES/DRESSINGS) ×3 IMPLANT
GLOVE SRG 8 PF TXTR STRL LF DI (GLOVE) ×1 IMPLANT
GLOVE SURG ENC TEXT LTX SZ6.5 (GLOVE) ×3 IMPLANT
GLOVE SURG LTX SZ8 (GLOVE) ×3 IMPLANT
GLOVE SURG UNDER POLY LF SZ6.5 (GLOVE) ×3 IMPLANT
GLOVE SURG UNDER POLY LF SZ8 (GLOVE) ×3
GOWN STRL REUS W/ TWL LRG LVL3 (GOWN DISPOSABLE) ×3 IMPLANT
GOWN STRL REUS W/TWL LRG LVL3 (GOWN DISPOSABLE) ×9
IV NS IRRIG 3000ML ARTHROMATIC (IV SOLUTION) ×8 IMPLANT
KIT BASIN OR (CUSTOM PROCEDURE TRAY) ×3 IMPLANT
KIT STR SPEAR 1.8 FBRTK DISP (KITS) ×2 IMPLANT
KIT TURNOVER KIT B (KITS) ×3 IMPLANT
MANIFOLD NEPTUNE II (INSTRUMENTS) ×3 IMPLANT
NDL HYPO 25X1 1.5 SAFETY (NEEDLE) ×1 IMPLANT
NDL SPNL 18GX3.5 QUINCKE PK (NEEDLE) ×1 IMPLANT
NEEDLE HYPO 25X1 1.5 SAFETY (NEEDLE) ×3 IMPLANT
NEEDLE SPNL 18GX3.5 QUINCKE PK (NEEDLE) ×3 IMPLANT
NS IRRIG 1000ML POUR BTL (IV SOLUTION) ×3 IMPLANT
PACK SHOULDER (CUSTOM PROCEDURE TRAY) ×3 IMPLANT
PAD ARMBOARD 7.5X6 YLW CONV (MISCELLANEOUS) ×6 IMPLANT
PAD COLD SHLDR WRAP-ON (PAD) ×2 IMPLANT
PORT APPOLLO RF 90DEGREE MULTI (SURGICAL WAND) ×3 IMPLANT
RESTRAINT HEAD UNIVERSAL NS (MISCELLANEOUS) ×3 IMPLANT
SLING ARM FOAM STRAP LRG (SOFTGOODS) IMPLANT
SPONGE T-LAP 4X18 ~~LOC~~+RFID (SPONGE) ×5 IMPLANT
STRIP CLOSURE SKIN 1/2X4 (GAUZE/BANDAGES/DRESSINGS) ×1 IMPLANT
SUCTION FRAZIER HANDLE 10FR (MISCELLANEOUS)
SUCTION TUBE FRAZIER 10FR DISP (MISCELLANEOUS) IMPLANT
SUT ETHILON 3 0 PS 1 (SUTURE) ×3 IMPLANT
SUT MNCRL AB 3-0 PS2 27 (SUTURE) ×2 IMPLANT
SUT VIC AB 0 CT1 27 (SUTURE) ×3
SUT VIC AB 0 CT1 27XBRD ANBCTR (SUTURE) IMPLANT
SUT VIC AB 1 CT1 36 (SUTURE) ×4 IMPLANT
SUT VIC AB 2-0 CT1 27 (SUTURE) ×3
SUT VIC AB 2-0 CT1 TAPERPNT 27 (SUTURE) IMPLANT
SUT VICRYL 0 UR6 27IN ABS (SUTURE) ×6 IMPLANT
SYS FBRTK BUTTON 2.6 (Anchor) ×3 IMPLANT
SYSTEM FBRTK BUTTON 2.6 (Anchor) IMPLANT
TOWEL GREEN STERILE (TOWEL DISPOSABLE) ×3 IMPLANT
TOWEL GREEN STERILE FF (TOWEL DISPOSABLE) ×3 IMPLANT
TUBING ARTHROSCOPY IRRIG 16FT (MISCELLANEOUS) ×3 IMPLANT
WATER STERILE IRR 1000ML POUR (IV SOLUTION) ×3 IMPLANT

## 2021-06-24 NOTE — H&P (Signed)
Cynthia Obrien is an 55 y.o. female.   Chief Complaint: left shoulder pain HPI: Patient  is a 55 year old patient with left shoulder pain.  Reports a constant ache in the shoulder but also pain which radiates into the biceps region.  She had cortisone injection about 4 weeks ago which helped for several weeks.  Her pain did improve except for the pain radiating into her biceps region.  MRI scan shows partial-thickness rotator cuff pathology along with arthritis and biceps tendon tendinitis.  She has not been able to resume upper extremity exercises.  Takes Tylenol for symptoms.  She does have psoriatic arthritis.  Would like to be feeling good before scuba trip in February.  Past Medical History:  Diagnosis Date   Arthritis    psoriac   COPD (chronic obstructive pulmonary disease) ([place])    Dysplastic nevus 02/13/2020   right inguinal crease, mild atypia    Fatigue    Hyperlipidemia    Hypothyroidism    Joint ache    Lower extremity edema    Psoriasis    SVT (supraventricular tachycardia) ([place])    a. 10/2011 Echo: EF 60-65%, Gr 2 DD.   Tuberculosis    patient denies this dx    Past Surgical History:  Procedure Laterality Date   ANKLE SURGERY     COLONOSCOPY     ENDOMETRIAL ABLATION     LAPAROSCOPIC GASTRIC BYPASS  01/2018   LUMBAR DISC SURGERY     tonsillectomy      Family History  Problem Relation Age of Onset   Heart disease Mother        MV repair; ICD   Hypertension Mother    Kidney disease Mother    Skin cancer Maternal Grandmother    Stroke Father    Diabetes Father    Hypertension Father    Diabetes Sister    Social History:  reports that she quit smoking about 23 years ago. Her smoking use included cigarettes. She has a 16.00 pack-year smoking history. She has never used smokeless tobacco. She reports current alcohol use of about 7.0 standard drinks per week. She reports that she does not currently use drugs.  Allergies:  Allergies  Allergen Reactions    Iodinated Diagnostic Agents Hives    Pt reported iv contrast caused mild  hives corrected with " one benadryl " 11/10: pt reports she can not recall if it was CT contrast or MR contrast that caused the hives.    Penicillins Hives   Amoxicillin Hives   Erythromycin Hives   Other Other (See Comments)    contrast dye - unspecified "all mycins cause hives"   Sulfa Antibiotics Rash    Medications Prior to Admission  Medication Sig Dispense Refill   Azelaic Acid (FINACEA) 15 % FOAM Apply to face 1-2 times a day for rosacea. (Patient taking differently: Apply 1 application topically 2 (two) times daily as needed (rosacea.).) 50 g 5   CALCIUM PO Take 1,500 Units by mouth daily.     CALCIUM-VITAMIN D PO Take 1 tablet by mouth in the morning, at noon, and at bedtime.     diltiazem (CARDIZEM CD) 120 MG 24 hr capsule Take 1 capsule (120 mg total) by mouth daily. 90 capsule 3   levothyroxine (SYNTHROID) 125 MCG tablet Take 125 mcg by mouth daily before breakfast.     Multiple Vitamins-Minerals (BARIATRIC MULTIVITAMINS/IRON PO) Take 1 tablet by mouth daily.     Potassium 99 MG TABS Take 99 mg by  mouth daily.     sertraline (ZOLOFT) 50 MG tablet Take 50 mg by mouth daily.     Ixekizumab (TALTZ) 80 MG/ML SOAJ Inject 80 mg into the skin every 28 (twenty-eight) days. For maintenance. 1 mL 5   predniSONE (DELTASONE) 50 MG tablet Pt to take 50 mg of prednisone on 05/28/21 at 1:45 AM, 50 mg of prednisone on 05/28/21 at 7:45 AM , and 50 mg of prednisone on 05/28/21 at 1:45 PM. Pt is also to take 50 mg of benadryl on 05/28/21 at 1:45 PM. Please call [phone] with any questions. (Patient not taking: Reported on 06/20/2021) 3 tablet 0    Results for orders placed or performed during the hospital encounter of 06/24/21 (from the past 48 hour(s))  CBC     Status: Abnormal   Collection Time: 06/24/21 12:00 PM  Result Value Ref Range   WBC 4.2 4.0 - 10.5 K/uL   RBC 4.04 3.87 - 5.11 MIL/uL   Hemoglobin 13.2 12.0  - 15.0 g/dL   HCT 41.2 36.0 - 46.0 %   MCV 102.0 (H) 80.0 - 100.0 fL   MCH 32.7 26.0 - 34.0 pg   MCHC 32.0 30.0 - 36.0 g/dL   RDW 14.3 11.5 - 15.5 %   Platelets 183 150 - 400 K/uL   nRBC 0.0 0.0 - 0.2 %    Comment: Performed at [facility], [place] [address] [place], [place] [address]  Basic metabolic panel     Status: Abnormal   Collection Time: 06/24/21 12:00 PM  Result Value Ref Range   Sodium 139 135 - 145 mmol/L   Potassium 4.3 3.5 - 5.1 mmol/L   Chloride 106 98 - 111 mmol/L   CO2 23 22 - 32 mmol/L   Glucose, Bld 78 70 - 99 mg/dL    Comment: Glucose reference range applies only to samples taken after fasting for at least 8 hours.   BUN 18 6 - 20 mg/dL   Creatinine, Ser 1.02 (H) 0.44 - 1.00 mg/dL   Calcium 9.0 8.9 - 10.3 mg/dL   GFR, Estimated >60 >60 mL/min    Comment: (NOTE) Calculated using the CKD-EPI Creatinine Equation (2021)    Anion gap 10 5 - 15    Comment: Performed at [facility] [address] [place], [place] [address]  Pregnancy, urine POC     Status: None   Collection Time: 06/24/21 12:12 PM  Result Value Ref Range   Preg Test, Ur NEGATIVE NEGATIVE    Comment:        THE SENSITIVITY OF THIS METHODOLOGY IS >24 mIU/mL    No results found.  Review of Systems  Musculoskeletal:  Positive for arthralgias.  All other systems reviewed and are negative.  Blood pressure 119/65, pulse (!) 58, temperature 97.8 F (36.6 C), temperature source Oral, resp. rate 16, height 5\' 5"  (1.651 m), weight 108.9 kg, SpO2 93 %. Physical Exam Vitals reviewed.  HENT:     Head: Normocephalic.     Nose: Nose normal.     Mouth/Throat:     Mouth: Mucous membranes are moist.  Eyes:     Pupils: Pupils are equal, round, and reactive to light.  Cardiovascular:     Rate and Rhythm: Normal rate.     Pulses: Normal pulses.  Pulmonary:     Effort: Pulmonary effort is normal.  Abdominal:     General: Abdomen is flat.  Musculoskeletal:     Cervical back: Normal  range  of motion.  Skin:    General: Skin is warm.     Capillary Refill: Capillary refill takes less than 2 seconds.  Neurological:     General: No focal deficit present.     Mental Status: She is alert.  Psychiatric:        Mood and Affect: Mood normal.    Ortho exam demonstrates full active and passive range of motion of that left shoulder.  She has pretty good rotator cuff strength instrument supra and subscap muscle testing.  No coarse grinding or crepitus with internal or external rotation of the left arm at 90 degrees of abduction.  No discrete AC joint tenderness is present.  Positive O'Brien's testing positive speeds testing on the left none on the right.  Cervical spine range of motion is full. Assessment/Plan Impression is left shoulder pain.  Plan is after long discussion about risk and benefits of surgery she would like to get arthroscopy and biceps tenodesis.  I told her we could not really address any of the arthritic symptoms in her shoulder but she has had that for a while and she feels like these other symptoms are newer.  I think it is possible that we could get 50 to 60% relief of her shoulder pain with the biceps tenodesis.  She may need further surgery in the future.  Patient understands risk and benefits.  All questions answered  Anderson Malta, MD 06/24/2021, 1:59 PM

## 2021-06-24 NOTE — Anesthesia Postprocedure Evaluation (Signed)
Anesthesia Post Note  Patient: Cynthia Obrien  Procedure(s) Performed: LEFT ARTHROSCOPY SHOULDER, DEBRIDEMENT, BICEPS TENODESIS (Left: Shoulder)     Patient location during evaluation: PACU Anesthesia Type: General Level of consciousness: sedated Pain management: pain level controlled Vital Signs Assessment: post-procedure vital signs reviewed and stable Respiratory status: spontaneous breathing and respiratory function stable Cardiovascular status: stable Postop Assessment: no apparent nausea or vomiting Anesthetic complications: no   No notable events documented.  Last Vitals:  Vitals:   06/24/21 1718 06/24/21 1733  BP: 133/78 139/64  Pulse: (!) 57 (!) 58  Resp: 19 15  Temp:  36.6 C  SpO2: 93% 97%    Last Pain:  Vitals:   06/24/21 1733  TempSrc:   PainSc: 0-No pain                 Early Ord DANIEL

## 2021-06-24 NOTE — Brief Op Note (Signed)
° °  06/24/2021  4:53 PM  PATIENT:  Cynthia Obrien  55 y.o. female  PRE-OPERATIVE DIAGNOSIS:  left shoulder biceps tendonitis  POST-OPERATIVE DIAGNOSIS:  left shoulder biceps tendonitis and arthritis  PROCEDURE:  Procedure(s): LEFT ARTHROSCOPY SHOULDER, DEBRIDEMENT, BICEPS TENODESIS  SURGEON:  Surgeon(s): Marlou Sa, Tonna Corner, MD  ASSISTANT: Annie Main, PA  ANESTHESIA:   general  EBL: 10 ml    Total I/O In: 800 [I.V.:800] Out: 30 [Blood:30]  BLOOD ADMINISTERED: none  DRAINS: none   LOCAL MEDICATIONS USED:  none  SPECIMEN:  No Specimen  COUNTS:  YES  TOURNIQUET:  * No tourniquets in log *  DICTATION: .Other Dictation: Dictation Number [id number]  PLAN OF CARE: Discharge to home after PACU  PATIENT DISPOSITION:  PACU - hemodynamically stable

## 2021-06-24 NOTE — Anesthesia Procedure Notes (Signed)
Anesthesia Regional Block: Interscalene brachial plexus block   Pre-Anesthetic Checklist: , timeout performed,  Correct Patient, Correct Site, Correct Laterality,  Correct Procedure, Correct Position, site marked,  Risks and benefits discussed,  Surgical consent,  Pre-op evaluation,  At surgeon's request and post-op pain management  Laterality: Left  Prep: chloraprep       Needles:  Injection technique: Single-shot  Needle Type: Echogenic Stimulator Needle     Needle Length: 5cm  Needle Gauge: 22     Additional Needles:   Narrative:  Start time: 06/24/2021 12:47 PM End time: 06/24/2021 12:58 PM Injection made incrementally with aspirations every 5 mL.  Performed by: Personally  Anesthesiologist: Duane Boston, MD  Additional Notes: Functioning IV was confirmed and monitors applied.  A 51mm 22ga echogenic arrow stimulator was used. Sterile prep and drape,hand hygiene and sterile gloves were used.Ultrasound guidance: relevant anatomy identified, needle position confirmed, local anesthetic spread visualized around nerve(s)., vascular puncture avoided.  Image printed for medical record.  Negative aspiration and negative test dose prior to incremental administration of local anesthetic. The patient tolerated the procedure well.

## 2021-06-24 NOTE — Anesthesia Procedure Notes (Signed)
Procedure Name: Intubation Date/Time: 06/24/2021 12:39 PM Performed by: Erick Colace, CRNA Pre-anesthesia Checklist: Patient identified, Emergency Drugs available, Suction available and Patient being monitored Patient Re-evaluated:Patient Re-evaluated prior to induction Oxygen Delivery Method: Circle system utilized Preoxygenation: Pre-oxygenation with 100% oxygen Induction Type: IV induction Ventilation: Mask ventilation without difficulty and Oral airway inserted - appropriate to patient size Laryngoscope Size: Mac and 4 Grade View: Grade II Tube type: Oral Tube size: 7.0 mm Number of attempts: 1 Airway Equipment and Method: Stylet and Oral airway Placement Confirmation: ETT inserted through vocal cords under direct vision, positive ETCO2 and breath sounds checked- equal and bilateral Secured at: 21 cm Tube secured with: Tape Dental Injury: Teeth and Oropharynx as per pre-operative assessment

## 2021-06-24 NOTE — Op Note (Signed)
Cynthia Obrien, Cynthia Obrien MEDICAL RECORD NO: [id number] ACCOUNT NO: [id number] DATE OF BIRTH: 04/10/1966 FACILITY: [facility] LOCATION: [facility] PHYSICIAN: Yetta Barre. Marlou Sa, MD  Operative Report   PREOPERATIVE DIAGNOSIS:  Left shoulder biceps tendinitis.  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  PROCEDURE:  Left shoulder arthroscopy with limited debridement of the superior labrum and biceps tendon with mini open biceps tenodesis using Arthrex all-suture anchors.  SURGEON:  Yetta Barre. Marlou Sa, MD.  ASSISTANT:  Annie Main, PA.  INDICATIONS:  The patient is a 55 year old patient with left shoulder pain refractory to nonoperative management and presents for operative management after explanation of risks and benefits.  We did discover in the course of her workup that she does  have shoulder arthritis, but has reasonably well maintained range of motion.  She would like to undergo biceps tenodesis for biceps tendinitis as a lot of the pain is radiating into her biceps region.  PROCEDURE IN DETAIL:  The patient was brought to the operating room where general anesthetic was induced.  Preoperative antibiotics were administered.  Timeout was called.  The patient was placed in the beach chair position with the head in neutral  position.  Left shoulder, arm and hand prescrubbed with alcohol and Betadine, allowed to air dry, prepped with DuraPrep solution and draped in a sterile manner.  Cynthia Obrien was used to seal the operative field and covering the axilla.  A timeout was called.   We had a 30-minute delay due to equipment issues.  The posterior portal was created 2 cm medial and inferior to the posterolateral margin of the acromion.  Diagnostic arthroscopy was performed.  The patient had grade 4 chondromalacia over about 30% of  her humeral head.  This was directly adjacent to the glenoid as well as in the superior aspect of the humeral  head.  Rotator cuff was intact.  Glenoid also had some grade 3-4 changes over about 75% of its surface area.  Biceps tendinitis was present.   Synovitis also present around the rotator interval, although her examination under anesthesia demonstrated passive range of motion of 70/95/175.  Anterior portal created under direct visualization.  The biceps tendon was released, superior labrum  debrided.  Synovitis also ablated.  Loose chondral fragments also debrided.  Instruments were removed.  Portals were closed using 3-0 nylon.  Ioban used to cover the entire operative field.  Incision made off the anterolateral margin of the acromion.   The incision was made.  Skin and subcutaneous tissue were sharply divided.  The raphae between the anterior middle deltoid was split.  A measured distance of 4 cm was marked with #1 Vicryl suture. Deltoid split was developed.  Self-retaining retractor  placed.  Bursectomy performed, not too much bursitis was present.  Transverse humeral ligament opened up and the biceps tendon was delivered into the incision.  Then, under appropriate tension, a 1.9 Arthrex SutureTak all-suture anchor was placed at the  inferior aspect of the bicipital groove.  3.2 anchor placed at the superior aspect.  FiberLoop suture was placed around the tendon, which facilitated placement of the anchor superiorly.  Both suture limbs from the superior aspect of the biceps tendon  were passed into the superior anchor, which gave excellent fixation.  Inferior anchor was then tightened after placing a luggage tag type suture around the tendon itself.  The tendon was then oversewn using 0 Vicryl suture.  Secured fixation under  appropriate tension of  the biceps tendon was achieved.  Next, thorough irrigation was performed.  Deltoid split closed using #1 Vicryl suture followed by interrupted inverted 0 Vicryl suture, 2-0 Vicryl suture, and 3-0 Monocryl with Steri-Strips applied.   Impervious dressing was applied  along with a shoulder sling.  The patient tolerated the procedure well without immediate complication and transferred to the recovery room in stable condition.  Cynthia Obrien assistance was required for opening, closing,  retraction.  His assistance was a medical necessity.   NIK D: 06/24/2021 4:59:49 pm T: 06/24/2021 11:25:00 pm  JOB: [id number]/ [id number]

## 2021-06-24 NOTE — Transfer of Care (Signed)
Immediate Anesthesia Transfer of Care Note  Patient: Cynthia Obrien  Procedure(s) Performed: LEFT ARTHROSCOPY SHOULDER, DEBRIDEMENT, BICEPS TENODESIS (Left: Shoulder)  Patient Location: PACU  Anesthesia Type:General  Level of Consciousness: awake, alert  and oriented  Airway & Oxygen Therapy: Patient Spontanous Breathing  Post-op Assessment: Report given to RN and Post -op Vital signs reviewed and stable  Post vital signs: Reviewed and stable  Last Vitals:  Vitals Value Taken Time  BP 127/75 06/24/21 1648  Temp 36.6 C 06/24/21 1648  Pulse 64 06/24/21 1652  Resp 17 06/24/21 1652  SpO2 94 % 06/24/21 1652  Vitals shown include unvalidated device data.  Last Pain:  Vitals:   06/24/21 1648  TempSrc:   PainSc: 0-No pain      Patients Stated Pain Goal: 0 (76/72/09 4709)  Complications: No notable events documented.

## 2021-06-25 ENCOUNTER — Encounter (HOSPITAL_COMMUNITY): Payer: Self-pay | Admitting: Orthopedic Surgery

## 2021-06-25 ENCOUNTER — Telehealth: Payer: Self-pay | Admitting: Radiology

## 2021-06-25 NOTE — Telephone Encounter (Signed)
Patient called triage line this morning stating that she had surgery yesterday and her discharge instructions tell her to use the CPM machine. She does not have this equipment and would like to know what she should do.  Please call back to advise. [phone]

## 2021-06-25 NOTE — Telephone Encounter (Signed)
I did not have that one. I will get it sent in

## 2021-06-25 NOTE — Telephone Encounter (Signed)
No CPM needed per Dr Marlou Sa. Luke called and discussed with patient.

## 2021-06-28 ENCOUNTER — Encounter: Payer: Self-pay | Admitting: Orthopedic Surgery

## 2021-06-28 NOTE — Progress Notes (Unsigned)
Talk to [place] on the phone.  Shoulder doing reasonably well.  Encouraged her to do as much motion as possible but avoid biceps curling activities.  She will follow-up on Wednesday.

## 2021-07-02 ENCOUNTER — Encounter: Payer: Self-pay | Admitting: Orthopedic Surgery

## 2021-07-02 ENCOUNTER — Ambulatory Visit (INDEPENDENT_AMBULATORY_CARE_PROVIDER_SITE_OTHER): Payer: BC Managed Care – PPO | Admitting: Surgical

## 2021-07-02 ENCOUNTER — Other Ambulatory Visit: Payer: Self-pay

## 2021-07-02 DIAGNOSIS — M19012 Primary osteoarthritis, left shoulder: Secondary | ICD-10-CM

## 2021-07-02 DIAGNOSIS — Z9889 Other specified postprocedural states: Secondary | ICD-10-CM

## 2021-07-02 NOTE — Progress Notes (Signed)
Post-Op Visit Note   Patient: SHAWNDELL Obrien           Date of Birth: 1965-07-17           MRN: [id number] Visit Date: 07/02/2021 PCP: Jilda Panda, MD   Assessment & Plan:  Chief Complaint:  Chief Complaint  Patient presents with   Left Shoulder - Routine Post Op    06/24/21 (8d) Left Arthroscopy Shoulder, Debridement, Biceps Tenodesis     Visit Diagnoses:  1. Primary osteoarthritis of left shoulder   2. S/P arthroscopy of left shoulder     Plan: Patient is a 55 year old female who presents s/p left shoulder arthroscopy with debridement and mini open biceps tenodesis.  She states that she is doing well.  Does not have a CPM machine.  She can lift her arm up without difficulty.  She states that pain is well controlled and she really only has to take oxycodone maybe once per day on some days and then right before she goes to bed on most nights.  Denies any fevers, chills, night sweats, drainage from the incisions.  On exam patient has 45 degrees external rotation, 80 degrees abduction, 150 degrees forward flexion.  Incisions are healing well without any evidence of infection or dehiscence.  Sutures removed and replaced with Steri-Strips. Nerve intact with deltoid firing.  Coarseness and grinding consistent with osteoarthritis noted with passive motion of the shoulder.  Intact EPL, FPL, finger abduction, pronation/supination, bicep flexion, tricep extension.  No Popeye deformity noted.  Mild ecchymosis noted anterior shoulder.  Plan is to have patient work on passive and active range of motion exercises at home.  She will go to see a physical therapist 1-2 times for designing of a home exercise program.  Cautioned her against any lifting with the operative arm or any strengthening exercises until about 6 weeks out from procedure.  She has pretty good range of motion on exam today. Follow-up in 4 weeks for clinical recheck with Dr. Marlou Sa  Follow-Up Instructions: No follow-ups on file.    Orders:  No orders of the defined types were placed in this encounter.  No orders of the defined types were placed in this encounter.   Imaging: No results found.  PMFS History: Patient Active Problem List   Diagnosis Date Noted   Morbid obesity ([place]) 03/08/2015   GERD (gastroesophageal reflux disease) 09/22/2013   Seasonal and perennial allergic rhinitis 07/16/2013   Dyspnea 12/19/2012   Cough 10/22/2012   Joint ache    Fatigue    Psoriasis    Edema 11/04/2011   Hypothyroid 11/04/2011   SVT (supraventricular tachycardia) ([place]) 10/01/2011   Hyperlipidemia 10/01/2011   Past Medical History:  Diagnosis Date   Arthritis    psoriac   COPD (chronic obstructive pulmonary disease) ([place])    Dysplastic nevus 02/13/2020   right inguinal crease, mild atypia    Fatigue    Hyperlipidemia    Hypothyroidism    Joint ache    Lower extremity edema    Psoriasis    SVT (supraventricular tachycardia) ([facility])    a. 10/2011 Echo: EF 60-65%, Gr 2 DD.   Tuberculosis    patient denies this dx    Family History  Problem Relation Age of Onset   Heart disease Mother        MV repair; ICD   Hypertension Mother    Kidney disease Mother    Skin cancer Maternal Grandmother    Stroke Father    Diabetes  Father    Hypertension Father    Diabetes Sister     Past Surgical History:  Procedure Laterality Date   ANKLE SURGERY     COLONOSCOPY     ENDOMETRIAL ABLATION     LAPAROSCOPIC GASTRIC BYPASS  01/2018   LUMBAR DISC SURGERY     SHOULDER ARTHROSCOPY Left 06/24/2021   Procedure: LEFT ARTHROSCOPY SHOULDER, DEBRIDEMENT, BICEPS TENODESIS;  Surgeon: Meredith Pel, MD;  Location: [facility];  Service: Orthopedics;  Laterality: Left;   tonsillectomy     Social History   Occupational History    Comment: Corporate investment banker  Tobacco Use   Smoking status: Former    Packs/day: 1.00    Years: 16.00    Pack years: 16.00    Types: Cigarettes    Quit date: 07/13/1997    Years since quitting:  23.9   Smokeless tobacco: Never  Vaping Use   Vaping Use: Never used  Substance and Sexual Activity   Alcohol use: Yes    Alcohol/week: 7.0 standard drinks    Types: 7 Glasses of wine per week    Comment: 1 glass of wine daily   Drug use: Not Currently   Sexual activity: Not on file    Comment: ablation

## 2021-07-02 NOTE — Addendum Note (Signed)
Addended byLaurann Montana on: 07/02/2021 11:16 AM   Modules accepted: Orders

## 2021-07-07 DIAGNOSIS — S43432A Superior glenoid labrum lesion of left shoulder, initial encounter: Secondary | ICD-10-CM

## 2021-07-09 ENCOUNTER — Ambulatory Visit: Payer: BC Managed Care – PPO | Admitting: Physical Therapy

## 2021-07-11 ENCOUNTER — Encounter: Payer: BC Managed Care – PPO | Admitting: Physical Therapy

## 2021-07-16 ENCOUNTER — Ambulatory Visit: Payer: BC Managed Care – PPO | Admitting: Physical Therapy

## 2021-07-16 ENCOUNTER — Encounter: Payer: Self-pay | Admitting: Physical Therapy

## 2021-07-16 ENCOUNTER — Ambulatory Visit: Payer: BC Managed Care – PPO | Attending: Surgical | Admitting: Physical Therapy

## 2021-07-16 ENCOUNTER — Encounter: Payer: BC Managed Care – PPO | Admitting: Physical Therapy

## 2021-07-16 DIAGNOSIS — G8929 Other chronic pain: Secondary | ICD-10-CM | POA: Insufficient documentation

## 2021-07-16 DIAGNOSIS — Z9889 Other specified postprocedural states: Secondary | ICD-10-CM | POA: Insufficient documentation

## 2021-07-16 DIAGNOSIS — M25512 Pain in left shoulder: Secondary | ICD-10-CM | POA: Diagnosis present

## 2021-07-16 NOTE — Therapy (Signed)
[facility] [address], [place], [address] Phone: [phone]   Fax:  [phone]  Physical Therapy Evaluation  Patient Details  Name: Cynthia Obrien MRN: [id number] Date of Birth: August 25, 1965 Referring Provider (PT): Gloriann Loan [place]   Encounter Date: 07/16/2021   PT End of Session - 07/16/21 0749     Visit Number 1    Number of Visits 17    Date for PT Re-Evaluation 09/12/21    Authorization - Visit Number 1    Authorization - Number of Visits 10    PT Start Time 0734    PT Stop Time 0815    PT Time Calculation (min) 41 min    Activity Tolerance Patient tolerated treatment well    Behavior During Therapy [facility] for tasks assessed/performed             Past Medical History:  Diagnosis Date   Arthritis    psoriac   COPD (chronic obstructive pulmonary disease) ([place])    Dysplastic nevus 02/13/2020   right inguinal crease, mild atypia    Fatigue    Hyperlipidemia    Hypothyroidism    Joint ache    Lower extremity edema    Psoriasis    SVT (supraventricular tachycardia) ([place])    a. 10/2011 Echo: EF 60-65%, Gr 2 DD.   Tuberculosis    patient denies this dx    Past Surgical History:  Procedure Laterality Date   ANKLE SURGERY     COLONOSCOPY     ENDOMETRIAL ABLATION     LAPAROSCOPIC GASTRIC BYPASS  01/2018   LUMBAR DISC SURGERY     SHOULDER ARTHROSCOPY Left 06/24/2021   Procedure: LEFT ARTHROSCOPY SHOULDER, DEBRIDEMENT, BICEPS TENODESIS;  Surgeon: Meredith Pel, MD;  Location: [facility];  Service: Orthopedics;  Laterality: Left;   tonsillectomy      There were no vitals filed for this visit.      OBJECTIVE  MUSCULOSKELETAL: Tremor: Normal Bulk: Normal Tone: Normal  Cervical Screen AROM: WFL and painless with overpressure in all planes Spurlings A (ipsilateral lateral flexion/axial compression):   Elbow Screen Elbow AROM: Hyper ext R elbow <10d, some tension at bicep  with full L elbow ext but full AROM and PROM  Palpation TTP with tension and concordant pain sign to L bicep muscle belly, latent trigger points with secondary TTP to L levator scapula and UT    Posture: Slight forward head rounded shoulders Observation: scapular dyskinesis with overhead abd/flex > 90d. Patient is able to achieve full overhead motion with true flex and abd to only about 90d. Beyond 90d patient demonstrates excessive shoulder hiking and scapular protraction on L.   Strength R/L 5/3 Shoulder flexion (anterior deltoid/pec major/coracobrachialis, axillary n. (C5-6) and musculocutaneous n. (C5-7)) 5/-3Shoulder abduction (deltoid/supraspinatus, axillary/suprascapular n, C5) 5/3 Shoulder external rotation (infraspinatus/teres minor) 5/3 Shoulder internal rotation (subcapularis/lats/pec major) 5/3 Shoulder extension (posterior deltoid, lats, teres major, axillary/thoracodorsal n.) 5/3 Elbow flexion (biceps brachii, brachialis, brachioradialis, musculoskeletal n, C5-6) 5/3 Elbow extension (triceps, radial n, C7) 5/5 Wrist Extension 5/5 Wrist Flexion 5/3 Latissimus 4+/3 Y lower trap  5/3 T Scapular Retractors *no break test performed on L shoulder/periscapular or elbow MMTs  AROM R/L 180/180 Shoulder flexion *with shoulder hiking and slight elbow flex maintained >90d on L 180/180 Shoulder abduction *with catch sensation at 90d approx with heavy scapular protraction with abd beyond 90d CTJ/CTJ Shoulder external rotation T5/L1 (not pressed for increased motion) Shoulder internal rotation 60/Not tested  past neutral Shoulder extension Thoracic and scapular motions WNL *Indicates pain, overpressure performed unless otherwise indicated  PROM R/L 180/approx 140d of true Shoulder flexion 180/approx 100d of true Shoulder abduction 90/90 Shoulder external rotation 70/70 Shoulder internal rotation 60/60 Shoulder extension *Indicates pain, overpressure performed unless  otherwise indicated  Accessory Motions/Glides Glenohumeral: Posterior: R: normal L: normal Inferior: R: not examined L: normal Anterior: R: not examined L: normal  Acromioclavicular:  Posterior: R: not examined L: normal Anterior: R: not examined L: normal   NEUROLOGICAL:  Mental Status Patient is oriented to person, place and time.  Recent memory is intact.  Remote memory is intact.  Attention span and concentration are intact.  Expressive speech is intact.  Patient's fund of knowledge is within normal limits for educational level.  Sensation Grossly intact to light touch bilateral UE as determined by testing dermatomes C2-T2 Proprioception and hot/cold testing deferred on this date   Ther-Ex PT reviewed the following HEP with patient with patient able to demonstrate a set of the following with min cuing for correction needed. PT educated patient on parameters of therex (how/when to inc/decrease intensity, frequency, rep/set range, stretch hold time, and purpose of therex) with verbalized understanding.  Access Code: [email or web address] Supine Shoulder Flexion Extension AAROM with Dowel - 2 x daily - 7 x weekly - 12-20 reps - 2sec hold Supine Shoulder Abduction AAROM with Dowel - 2 x daily - 7 x weekly - 12-20 reps - 2sec hold Seated Levator Scapulae Stretch - 3 x daily - 7 x weekly - 30-60sec hold Seated Cervical Sidebending Stretch - 3 x daily - 7 x weekly - 30-60sec hold              [facility] PT Assessment - 07/16/21 0001       Assessment   Medical Diagnosis L bicep tenodesis with debridement    Referring Provider (PT) Gloriann Loan PA    Onset Date/Surgical Date 06/24/21    Hand Dominance Right    Next MD Visit 07/30/21    Prior Therapy yes      Precautions   Precautions Shoulder    Type of Shoulder Precautions 6 weeks no lifting or excessive ext      Balance Screen   Has the patient fallen in the past 6 months No    Has the patient had a decrease in activity  level because of a fear of falling?  No    Is the patient reluctant to leave their home because of a fear of falling?  No                        Objective measurements completed on examination: See above findings.                PT Education - 07/16/21 0749     Education Details Patient was educated on diagnosis, anatomy and pathology involved, prognosis, role of PT, and was given an HEP, demonstrating exercise with proper form following verbal and tactile cues, and was given a paper hand out to continue exercise at home. Pt was educated on and agreed to plan of care.    Person(s) Educated Patient    Methods Explanation;Verbal cues;Tactile cues;Demonstration    Comprehension Verbalized understanding;Returned demonstration;Verbal cues required;Tactile cues required              PT Short Term Goals - 07/16/21 0902       PT SHORT TERM GOAL #1   Title  Pt will be independent with HEP in order to improve strength and decrease pain in order to improve pain-free function at home and work.    Baseline 07/16/21 HEP given    Time 4    Period Weeks    Status New               PT Long Term Goals - 07/16/21 1005       PT LONG TERM GOAL #1   Title Patient will increase FOTO score to 72 to demonstrate predicted increase in functional mobility to complete ADLs    Baseline 07/16/21 47    Time 8    Period Weeks    Status New      PT LONG TERM GOAL #2   Title Pt will decrease worst pain as reported on NPRS by at least 3 points in order to demonstrate clinically significant reduction in pain.    Baseline 07/16/21 3/10    Time 8    Period Weeks    Status New      PT LONG TERM GOAL #3   Title Pt will increase gross shoulder and periscapular strength to at least 4+/5 MMT grade in order to demonstrate improvement in strength and function needed for heavy household ADLs    Baseline 07/16/21 unable to perform break test 3/5 at least for all    Time 8    Period Weeks     Status New      PT LONG TERM GOAL #4   Title Pt will demonstrate full active shoulder ROM without scapular dyskenesis in order to demonstrate mobility needed for ADLs    Baseline 07/16/21 Flex and abd "full" with poor scapulohumeral rhythm approx 90d of true flex and abd; IR L1    Time 8    Period Weeks    Status New                    Plan - 07/16/21 1201     Clinical Impression Statement Pt is a 56 year old female presenting s/p L bicep tenodesis with debridement 06/24/21. Impairments in shoulder mobility, abnormal posture, decreased motor control (scapular dyskinesis with overhead mobility), decreased shoulder and periscapular strength, muscle tension, and pain. 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 Pt with surgical precatuions of no lifting or excessive shoulder ext for 6 weeks. Would benefit from skilled PT to address above deficits and promote optimal return to PLOF.    Personal Factors and Comorbidities Comorbidity 2;Fitness;Time since onset of injury/illness/exacerbation    Comorbidities HLD, GERD    Examination-Activity Limitations Carry;Lift;Dressing;Reach Overhead;Bathing;Sleep    Examination-Participation Restrictions Cleaning;Laundry;Community Activity;Meal Prep;Occupation    Stability/Clinical Decision Making Evolving/Moderate complexity    Clinical Decision Making Moderate    Rehab Potential Good    PT Frequency 2x / week    PT Duration 8 weeks    PT Treatment/Interventions ADLs/Self Care Home Management;Cryotherapy;Fluidtherapy;Iontophoresis 4mg /ml Dexamethasone;Functional mobility training;Therapeutic exercise;Passive range of motion;Spinal Manipulations;Joint Manipulations;Dry needling;Traction;Ultrasound;DME Instruction;Moist Heat;Electrical Stimulation;Therapeutic activities;Neuromuscular  re-education;Patient/family education;Manual techniques;Taping    PT Next Visit Plan HEP review, scapulohumeral rhythm    PT Home Exercise Plan supine shoulder flex and abd AAROM with focus on scapulohumeral rhythm, levator and UT stretch    Consulted and Agree with Plan of Care Patient             Patient will benefit  from skilled therapeutic intervention in order to improve the following deficits and impairments:  Decreased activity tolerance, Decreased endurance, Decreased range of motion, Decreased strength, Increased fascial restricitons, Impaired UE functional use, Postural dysfunction, Pain, Decreased mobility, Impaired flexibility, Impaired tone  Visit Diagnosis: Chronic left shoulder pain - Plan: PT plan of care cert/re-cert     Problem List Patient Active Problem List   Diagnosis Date Noted   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, [place], [address] Phone: [phone]   Fax:  [phone]  Name: Cynthia Obrien MRN: [id number] Date of Birth: July 26, 1965

## 2021-07-21 ENCOUNTER — Encounter: Payer: BC Managed Care – PPO | Admitting: Physical Therapy

## 2021-07-23 ENCOUNTER — Ambulatory Visit: Payer: BC Managed Care – PPO | Admitting: Physical Therapy

## 2021-07-23 ENCOUNTER — Encounter: Payer: Self-pay | Admitting: Physical Therapy

## 2021-07-23 DIAGNOSIS — M25512 Pain in left shoulder: Secondary | ICD-10-CM | POA: Diagnosis not present

## 2021-07-23 DIAGNOSIS — G8929 Other chronic pain: Secondary | ICD-10-CM

## 2021-07-23 NOTE — Therapy (Signed)
[facility] [address], [place], [address] Phone: [phone]   Fax:  [phone]  Physical Therapy Treatment  Patient Details  Name: Cynthia Obrien MRN: [id number] Date of Birth: 07-21-1965 Referring Provider (PT): Gloriann Loan [place]   Encounter Date: 07/23/2021   PT End of Session - 07/23/21 0752     Visit Number 2    Number of Visits 17    Date for PT Re-Evaluation 09/12/21    Authorization - Visit Number 2    Authorization - Number of Visits 10    PT Start Time 0732    PT Stop Time 0810    PT Time Calculation (min) 38 min    Activity Tolerance Patient tolerated treatment well    Behavior During Therapy [facility] for tasks assessed/performed             Past Medical History:  Diagnosis Date   Arthritis    psoriac   COPD (chronic obstructive pulmonary disease) ([place])    Dysplastic nevus 02/13/2020   right inguinal crease, mild atypia    Fatigue    Hyperlipidemia    Hypothyroidism    Joint ache    Lower extremity edema    Psoriasis    SVT (supraventricular tachycardia) ([place])    a. 10/2011 Echo: EF 60-65%, Gr 2 DD.   Tuberculosis    patient denies this dx    Past Surgical History:  Procedure Laterality Date   ANKLE SURGERY     COLONOSCOPY     ENDOMETRIAL ABLATION     LAPAROSCOPIC GASTRIC BYPASS  01/2018   LUMBAR DISC SURGERY     SHOULDER ARTHROSCOPY Left 06/24/2021   Procedure: LEFT ARTHROSCOPY SHOULDER, DEBRIDEMENT, BICEPS TENODESIS;  Surgeon: Cynthia Pel, MD;  Location: [place];  Service: Orthopedics;  Laterality: Left;   tonsillectomy      There were no vitals filed for this visit.   Subjective Assessment - 07/23/21 0736     Subjective Patient reports she was a little achy after last session, with some pain in her elbow today, just reports this as  1/10. Reports compliance with HEP, and adherence to precautions of no lifting or resisted motion to the RUE. No changes in  medical history or medications since last visit.    Pertinent History Pt is a 56 year old female presenting with L bicep tenodeis with debridement 06/24/21 Cynthia Obrien. Patient with history of chronic L shoulder pain, cleaned out her garage and it was worse, and went to orthopedist who diagnosed her with SLAP tear, advised surgery. Following surgery patient reports much less pain and good mobility. Current pain 1/10 worst 3/10; best 0/10. Patient reports most of her pain is in her upper arm at her bicep, more so than the shoulder, comes on quick with a sharp catch when she moves her arm laterally away from her body, and (though she remarks she is not supposed to) when she reaches behind her back on accident. Post op resistrictions no lifting or ext for 6 weeks. 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 Her post op restrictions are not currently limiting her at work, but more so in her ADLs (unable to complete heavy household cleaning, modifying bathing/dressing/feeding), and her normal weight lifting regimen. She completes a weight lifting regimen with the help of a personal trainer 4days per week, and  water aerobics 2x/week. Pt denies N/V, B&B changes, unexplained weight fluctuation, saddle paresthesia, fever, night sweats, or unrelenting night pain at this time.    Limitations Lifting;House hold activities;Reading    How long can you sit comfortably? unlimited    How long can you stand comfortably? unlimited    How long can you walk comfortably? unlimited    Patient Stated Goals None since surgery    Pain Onset More than a month ago                Ther-Ex Supine Shoulder Flexion Extension AAROM with Dowel x12 with min cuing to decrease thoracic ext Supine Shoulder Abduction AAROM with Dowel x15 with cuing to maintain UE contact with table Attempted pulleys flex with patient reporting "catch" on lowering discontinued;  pulleys aarom abd x12 without pain or catch, min cuing for technique with good carry over AAROM flex on stair handrail with towel x12 (seated)  AROM flex short lever x12; long lever x12 in pain free range only AROM abd short lever x12; long lever x12 in pain free range only Seated AROM ER/IR with elbow on thorax x15 without pain AROM wall walks x12 with min cuing for scapular depression with good carry over UT stretch 2x 30sec hold with min cuing for upright posture                       PT Education - 07/23/21 0752     Education Details therex form/technique    Person(s) Educated Patient    Methods Explanation;Demonstration;Verbal cues    Comprehension Verbalized understanding;Returned demonstration;Verbal cues required              PT Short Term Goals - 07/16/21 0902       PT SHORT TERM GOAL #1   Title Pt will be independent with HEP in order to improve strength and decrease pain in order to improve pain-free function at home and work.    Baseline 07/16/21 HEP given    Time 4    Period Weeks    Status New               PT Long Term Goals - 07/16/21 1005       PT LONG TERM GOAL #1   Title Patient will increase FOTO score to 72 to demonstrate predicted increase in functional mobility to complete ADLs    Baseline 07/16/21 47    Time 8    Period Weeks    Status New      PT LONG TERM GOAL #2   Title Pt will decrease worst pain as reported on NPRS by at least 3 points in order to demonstrate clinically significant reduction in pain.    Baseline 07/16/21 3/10    Time 8    Period Weeks    Status New      PT LONG TERM GOAL #3   Title Pt will increase gross shoulder and periscapular strength to at least 4+/5 MMT grade in order to demonstrate improvement in strength and function needed for heavy household ADLs    Baseline 07/16/21 unable to perform break test 3/5 at least for all    Time 8    Period Weeks    Status New      PT LONG TERM GOAL #4    Title Pt will demonstrate full active shoulder ROM without scapular dyskenesis in order to demonstrate mobility needed for ADLs    Baseline 07/16/21 Flex and abd "  full" with poor scapulohumeral rhythm approx 90d of true flex and abd; IR L1    Time 8    Period Weeks    Status New                   Plan - 07/23/21 2229     Clinical Impression Statement PT initated therex progression for pain free, shoulder and elbow mobility with success. Patient is able to comply with all cuing for proper technique of therex without increased pain- some modifications needed. PT updated HEP to reflect finger climbs on wall with understanding. PT reviewed HEP with patient with minimal corrections needed and understadning of these. PT will continue progression as able.    Personal Factors and Comorbidities Comorbidity 2;Fitness;Time since onset of injury/illness/exacerbation    Comorbidities HLD, GERD    Examination-Activity Limitations Carry;Lift;Dressing;Reach Overhead;Bathing;Sleep    Examination-Participation Restrictions Cleaning;Laundry;Community Activity;Meal Prep;Occupation    Stability/Clinical Decision Making Evolving/Moderate complexity    Clinical Decision Making Moderate    Rehab Potential Good    PT Frequency 2x / week    PT Duration 8 weeks    PT Treatment/Interventions ADLs/Self Care Home Management;Cryotherapy;Fluidtherapy;Iontophoresis 4mg /ml Dexamethasone;Functional mobility training;Therapeutic exercise;Passive range of motion;Spinal Manipulations;Joint Manipulations;Dry needling;Traction;Ultrasound;DME Instruction;Moist Heat;Electrical Stimulation;Therapeutic activities;Neuromuscular re-education;Patient/family education;Manual techniques;Taping    PT Next Visit Plan HEP review, scapulohumeral rhythm    PT Home Exercise Plan supine shoulder flex and abd AAROM with focus on scapulohumeral rhythm, levator and UT stretch    Consulted and Agree with Plan of Care Patient              Patient will benefit from skilled therapeutic intervention in order to improve the following deficits and impairments:  Decreased activity tolerance, Decreased endurance, Decreased range of motion, Decreased strength, Increased fascial restricitons, Impaired UE functional use, Postural dysfunction, Pain, Decreased mobility, Impaired flexibility, Impaired tone  Visit Diagnosis: Chronic left shoulder pain     Problem List Patient Active Problem List   Diagnosis Date Noted   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, [place], [address] Phone: [phone]   Fax:  [phone]  Name: Cynthia Obrien MRN: [id number] Date of Birth: 12-Jul-1966

## 2021-07-29 ENCOUNTER — Encounter: Payer: BC Managed Care – PPO | Admitting: Physical Therapy

## 2021-07-30 ENCOUNTER — Ambulatory Visit (INDEPENDENT_AMBULATORY_CARE_PROVIDER_SITE_OTHER): Payer: BC Managed Care – PPO | Admitting: Orthopedic Surgery

## 2021-07-30 ENCOUNTER — Encounter: Payer: Self-pay | Admitting: Orthopedic Surgery

## 2021-07-30 ENCOUNTER — Other Ambulatory Visit: Payer: Self-pay

## 2021-07-30 DIAGNOSIS — Z9889 Other specified postprocedural states: Secondary | ICD-10-CM

## 2021-07-30 NOTE — Progress Notes (Signed)
Post-Op Visit Note   Patient: Cynthia Obrien           Date of Birth: [birth year]           MRN: [id number] Visit Date: 07/30/2021 PCP: Jilda Panda, MD   Assessment & Plan:  Chief Complaint:  Chief Complaint  Patient presents with   Left Shoulder - Routine Post Op     06/24/21 (5w 1d) Left Arthroscopy Shoulder, Debridement, Biceps Tenodesis     Visit Diagnoses:  1. S/P arthroscopy of left shoulder     Plan: Cynthia Obrien is a 56 year old patient who is now about 4 weeks out left shoulder arthroscopy with biceps tenodesis.  She has been doing well.  On exam she has range of motion of 70/80/165.  Incision looks intact.  Rotator cuff strength is excellent.  No Popeye deformity.  Plan is to start physical therapy at 6 weeks postop next week for range of motion and gentle strengthening.  Overall the knee looks good and her shoulder has improved significantly considering her preoperative status.  Follow-up with [place] as needed.  Follow-Up Instructions: Return if symptoms worsen or fail to improve.   Orders:  No orders of the defined types were placed in this encounter.  No orders of the defined types were placed in this encounter.   Imaging: No results found.  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    Morbid obesity ([place]) 03/08/2015   GERD (gastroesophageal reflux disease) 09/22/2013   Seasonal and perennial allergic rhinitis 07/16/2013   Dyspnea 12/19/2012   Cough 10/22/2012   Joint ache    Fatigue    Psoriasis    Edema 11/04/2011   Hypothyroid 11/04/2011   SVT (supraventricular tachycardia) ([place]) 10/01/2011   Hyperlipidemia 10/01/2011   Past Medical History:  Diagnosis Date   Arthritis    psoriac   COPD (chronic obstructive pulmonary disease) ([place])    Dysplastic nevus 02/13/2020   right inguinal crease, mild atypia    Fatigue    Hyperlipidemia    Hypothyroidism     Joint ache    Lower extremity edema    Psoriasis    SVT (supraventricular tachycardia) ([place])    a. 10/2011 Echo: EF 60-65%, Gr 2 DD.   Tuberculosis    patient denies this dx    Family History  Problem Relation Age of Onset   Heart disease Mother        MV repair; ICD   Hypertension Mother    Kidney disease Mother    Skin cancer Maternal Grandmother    Stroke Father    Diabetes Father    Hypertension Father    Diabetes Sister     Past Surgical History:  Procedure Laterality Date   ANKLE SURGERY     COLONOSCOPY     ENDOMETRIAL ABLATION     LAPAROSCOPIC GASTRIC BYPASS  01/2018   LUMBAR [place] SURGERY     SHOULDER ARTHROSCOPY Left 06/24/2021   Procedure: LEFT ARTHROSCOPY SHOULDER, DEBRIDEMENT, BICEPS TENODESIS;  Surgeon: Meredith Pel, MD;  Location: [facility];  Service: Orthopedics;  Laterality: Left;   tonsillectomy     Social History   Occupational History    Comment: Corporate investment banker  Tobacco Use   Smoking status: Former    Packs/day: 1.00    Years: 16.00    Pack years: 16.00    Types: Cigarettes    Quit date: 07/13/1997  Years since quitting: 24.0   Smokeless tobacco: Never  Vaping Use   Vaping Use: Never used  Substance and Sexual Activity   Alcohol use: Yes    Alcohol/week: 7.0 standard drinks    Types: 7 Glasses of wine per week    Comment: 1 glass of wine daily   Drug use: Not Currently   Sexual activity: Not on file    Comment: ablation

## 2021-07-31 ENCOUNTER — Ambulatory Visit: Payer: BC Managed Care – PPO | Admitting: Physical Therapy

## 2021-07-31 ENCOUNTER — Encounter: Payer: Self-pay | Admitting: Physical Therapy

## 2021-07-31 DIAGNOSIS — G8929 Other chronic pain: Secondary | ICD-10-CM

## 2021-07-31 DIAGNOSIS — M25512 Pain in left shoulder: Secondary | ICD-10-CM | POA: Diagnosis not present

## 2021-07-31 NOTE — Therapy (Signed)
[facility] [address], [place], [address] Phone: [phone]   Fax:  [phone]  Physical Therapy Treatment  Patient Details  Name: Cynthia Obrien MRN: [id number] Date of Birth: 1966/01/23 Referring Provider (PT): Gloriann Loan [place]   Encounter Date: 07/31/2021   PT End of Session - 07/31/21 0744     Visit Number 3    Number of Visits 17    Date for PT Re-Evaluation 09/12/21    Authorization - Visit Number 3    Authorization - Number of Visits 10    PT Start Time 0732    PT Stop Time 0810    PT Time Calculation (min) 38 min    Activity Tolerance Patient tolerated treatment well    Behavior During Therapy [facility] for tasks assessed/performed             Past Medical History:  Diagnosis Date   Arthritis    psoriac   COPD (chronic obstructive pulmonary disease) ([place])    Dysplastic nevus 02/13/2020   right inguinal crease, mild atypia    Fatigue    Hyperlipidemia    Hypothyroidism    Joint ache    Lower extremity edema    Psoriasis    SVT (supraventricular tachycardia) ([place])    a. 10/2011 Echo: EF 60-65%, Gr 2 DD.   Tuberculosis    patient denies this dx    Past Surgical History:  Procedure Laterality Date   ANKLE SURGERY     COLONOSCOPY     ENDOMETRIAL ABLATION     LAPAROSCOPIC GASTRIC BYPASS  01/2018   LUMBAR DISC SURGERY     SHOULDER ARTHROSCOPY Left 06/24/2021   Procedure: LEFT ARTHROSCOPY SHOULDER, DEBRIDEMENT, BICEPS TENODESIS;  Surgeon: Meredith Pel, MD;  Location: [place];  Service: Orthopedics;  Laterality: Left;   tonsillectomy      There were no vitals filed for this visit.   Subjective Assessment - 07/31/21 0738     Subjective Pt reports no pain today, good report from MD yesterday, with advisement to start strengthening next week. Compliance with HEP without questions or concerns.    Pertinent History Pt is a 56 year old female presenting with L bicep tenodeis  with debridement 06/24/21 [name]. Patient with history of chronic L shoulder pain, cleaned out her garage and it was worse, and went to orthopedist who diagnosed her with SLAP tear, advised surgery. Following surgery patient reports much less pain and good mobility. Current pain 1/10 worst 3/10; best 0/10. Patient reports most of her pain is in her upper arm at her bicep, more so than the shoulder, comes on quick with a sharp catch when she moves her arm laterally away from her body, and (though she remarks she is not supposed to) when she reaches behind her back on accident. Post op resistrictions no lifting or ext for 6 weeks. 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 Her post op restrictions are not currently limiting her at work, but more so in her ADLs (unable to complete heavy household cleaning, modifying bathing/dressing/feeding), and her normal weight lifting regimen. She completes a weight lifting regimen with the help of a personal trainer 4days per week, and water aerobics 2x/week. Pt denies N/V, B&B changes, unexplained weight fluctuation, saddle paresthesia, fever, night sweats, or unrelenting night pain at this time.  Limitations Lifting;House hold activities;Reading    How long can you sit comfortably? unlimited    How long can you stand comfortably? unlimited    How long can you walk comfortably? unlimited    Patient Stated Goals None since surgery    Pain Onset More than a month ago            Ther-Ex Supine Shoulder Flexion Extension AAROM with Dowel x12 with min cuing to decrease thoracic ext Supine Shoulder Abduction AAROM with Dowel x15 with cuing to maintain UE contact with table Attempted pulleys flex with patient reporting "catch" on lowering discontinued; pulleys aarom abd x12 without pain or catch, min cuing for technique with good carry over AROM shoulder flex long lever 2x 12 with cuing  for scapulohumeral rhythm with good carry over AROM abd long lever 2x 12 with continued education on scapulohumeral rhythm with good carry over AROM ER with elbow at rib cage x12 Seated AROM ER/IR with elbow on thorax x15 without pain AROM wall walks x12 with min cuing for scapular depression with good carry over UT stretch 2x 30sec hold with min cuing for upright posture                            PT Education - 07/31/21 0744     Education Details therex form/technique    Person(s) Educated Patient    Methods Explanation;Verbal cues;Demonstration    Comprehension Verbalized understanding;Verbal cues required;Returned demonstration              PT Short Term Goals - 07/16/21 0902       PT SHORT TERM GOAL #1   Title Pt will be independent with HEP in order to improve strength and decrease pain in order to improve pain-free function at home and work.    Baseline 07/16/21 HEP given    Time 4    Period Weeks    Status New               PT Long Term Goals - 07/16/21 1005       PT LONG TERM GOAL #1   Title Patient will increase FOTO score to 72 to demonstrate predicted increase in functional mobility to complete ADLs    Baseline 07/16/21 47    Time 8    Period Weeks    Status New      PT LONG TERM GOAL #2   Title Pt will decrease worst pain as reported on NPRS by at least 3 points in order to demonstrate clinically significant reduction in pain.    Baseline 07/16/21 3/10    Time 8    Period Weeks    Status New      PT LONG TERM GOAL #3   Title Pt will increase gross shoulder and periscapular strength to at least 4+/5 MMT grade in order to demonstrate improvement in strength and function needed for heavy household ADLs    Baseline 07/16/21 unable to perform break test 3/5 at least for all    Time 8    Period Weeks    Status New      PT LONG TERM GOAL #4   Title Pt will demonstrate full active shoulder ROM without scapular dyskenesis in order to  demonstrate mobility needed for ADLs    Baseline 07/16/21 Flex and abd "full" with poor scapulohumeral rhythm approx 90d of true flex and abd; IR L1    Time 8  Period Weeks    Status New                   Plan - 07/31/21 0746     Clinical Impression Statement PT continued therex progression for shoulder mobility with patient demonstrate much better ROM with near full AAROM this session. Patient is able to comply with all cuing for proper technique of therex with good carry over of scapulohumeral rhythm, and good motivation throughout session. Patient reports no increased pain throughout session, and is able to perform pulleys AAROM without pain this session. PT will begin resistance progression next week.    Personal Factors and Comorbidities Comorbidity 2;Fitness;Time since onset of injury/illness/exacerbation    Comorbidities HLD, GERD    Examination-Activity Limitations Carry;Lift;Dressing;Reach Overhead;Bathing;Sleep    Examination-Participation Restrictions Cleaning;Laundry;Community Activity;Meal Prep;Occupation    Stability/Clinical Decision Making Evolving/Moderate complexity    Clinical Decision Making Moderate    Rehab Potential Good    PT Frequency 2x / week    PT Duration 8 weeks    PT Treatment/Interventions ADLs/Self Care Home Management;Cryotherapy;Fluidtherapy;Iontophoresis 4mg /ml Dexamethasone;Functional mobility training;Therapeutic exercise;Passive range of motion;Spinal Manipulations;Joint Manipulations;Dry needling;Traction;Ultrasound;DME Instruction;Moist Heat;Electrical Stimulation;Therapeutic activities;Neuromuscular re-education;Patient/family education;Manual techniques;Taping    PT Next Visit Plan HEP review, scapulohumeral rhythm    PT Home Exercise Plan supine shoulder flex and abd AAROM with focus on scapulohumeral rhythm, levator and UT stretch    Consulted and Agree with Plan of Care Patient             Patient will benefit from skilled  therapeutic intervention in order to improve the following deficits and impairments:  Decreased activity tolerance, Decreased endurance, Decreased range of motion, Decreased strength, Increased fascial restricitons, Impaired UE functional use, Postural dysfunction, Pain, Decreased mobility, Impaired flexibility, Impaired tone  Visit Diagnosis: Chronic left shoulder pain     Problem List Patient Active Problem List   Diagnosis Date Noted   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 [place], [address] Phone: [phone]   Fax:  [phone]  Name: Cynthia Obrien MRN: [id number] Date of Birth: 11/06/1965

## 2021-08-04 ENCOUNTER — Ambulatory Visit: Payer: BC Managed Care – PPO | Admitting: Physical Therapy

## 2021-08-04 ENCOUNTER — Encounter: Payer: BC Managed Care – PPO | Admitting: Physical Therapy

## 2021-08-04 ENCOUNTER — Encounter: Payer: Self-pay | Admitting: Physical Therapy

## 2021-08-04 DIAGNOSIS — M25512 Pain in left shoulder: Secondary | ICD-10-CM | POA: Diagnosis not present

## 2021-08-04 DIAGNOSIS — G8929 Other chronic pain: Secondary | ICD-10-CM

## 2021-08-04 NOTE — Therapy (Signed)
[facility] [address], [place], [address] Phone: [phone]   Fax:  [phone]  Physical Therapy Treatment  Patient Details  Name: Cynthia Obrien MRN: [id number] Date of Birth: 07/25/1965 Referring Provider (PT): Gloriann Loan [place]   Encounter Date: 08/04/2021   PT End of Session - 08/04/21 0740     Visit Number 4    Number of Visits 17    Date for PT Re-Evaluation 09/12/21    Authorization - Visit Number 4    Authorization - Number of Visits 10    PT Start Time 0735    PT Stop Time 0813    PT Time Calculation (min) 38 min    Activity Tolerance Patient tolerated treatment well    Behavior During Therapy [facility] for tasks assessed/performed             Past Medical History:  Diagnosis Date   Arthritis    psoriac   COPD (chronic obstructive pulmonary disease) ([place])    Dysplastic nevus 02/13/2020   right inguinal crease, mild atypia    Fatigue    Hyperlipidemia    Hypothyroidism    Joint ache    Lower extremity edema    Psoriasis    SVT (supraventricular tachycardia) ([place])    a. 10/2011 Echo: EF 60-65%, Gr 2 DD.   Tuberculosis    patient denies this dx    Past Surgical History:  Procedure Laterality Date   ANKLE SURGERY     COLONOSCOPY     ENDOMETRIAL ABLATION     LAPAROSCOPIC GASTRIC BYPASS  01/2018   LUMBAR DISC SURGERY     SHOULDER ARTHROSCOPY Left 06/24/2021   Procedure: LEFT ARTHROSCOPY SHOULDER, DEBRIDEMENT, BICEPS TENODESIS;  Surgeon: Meredith Pel, MD;  Location: [place];  Service: Orthopedics;  Laterality: Left;   tonsillectomy      There were no vitals filed for this visit.   Subjective Assessment - 08/04/21 0738     Subjective Pt reports no pain, popping or clicking sensations. No other updates to note.    Pertinent History Pt is a 56 year old female presenting with L bicep tenodeis with debridement 06/24/21 [name]. Patient with history of chronic L shoulder  pain, cleaned out her garage and it was worse, and went to orthopedist who diagnosed her with SLAP tear, advised surgery. Following surgery patient reports much less pain and good mobility. Current pain 1/10 worst 3/10; best 0/10. Patient reports most of her pain is in her upper arm at her bicep, more so than the shoulder, comes on quick with a sharp catch when she moves her arm laterally away from her body, and (though she remarks she is not supposed to) when she reaches behind her back on accident. Post op resistrictions no lifting or ext for 6 weeks. 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 Her post op restrictions are not currently limiting her at work, but more so in her ADLs (unable to complete heavy household cleaning, modifying bathing/dressing/feeding), and her normal weight lifting regimen. She completes a weight lifting regimen with the help of a personal trainer 4days per week, and water aerobics 2x/week. Pt denies N/V, B&B changes, unexplained weight fluctuation, saddle paresthesia, fever, night sweats, or unrelenting night pain at this time.    Limitations Lifting;House hold activities;Reading    How long can you  sit comfortably? unlimited    How long can you stand comfortably? unlimited    How long can you walk comfortably? unlimited    Patient Stated Goals None since surgery    Pain Onset More than a month ago            Ther-Ex UBE 42mins FWD 30min BWD seat 5 L5 for gentle strengthening Standing row GTB x10 with ease; BluTB 2x 10 with better carry over of cuing, cuing for eccentric control with good carry voer Standing bilat shoulder ext BluTB 3x 10 with good carry over of set up demo and cuing Seated bicep curl 5# to just thighs (about 75% full range) 3x 10 with min cuing for eccentric control  Supine tricep ext 5# 3x 04/20/09  AAROM wall slide with forward step x12 3sec hold Post shoulder rolls with focus  on scapular retraction + depression x15   Durwin Reges DPT Access Code: [place] Standing Row with Anchored Resistance - 1 x daily - 1-2 x weekly - 3 sets - 6-12 reps Shoulder extension with resistance - Neutral - 1 x daily - 1-2 x weekly - 3 sets - 6-12 reps Seated Bicep Curls Supinated with Dumbbells - 1 x daily - 1-2 x weekly - 3 sets - 6-12 reps Supine Elbow Flexion Extension AROM - 1 x daily - 1-2 x weekly - 3 sets - 6-12 reps                            PT Education - 08/04/21 0739     Education Details therex form/technique    Person(s) Educated Patient    Methods Explanation;Demonstration;Verbal cues    Comprehension Returned demonstration;Verbal cues required;Verbalized understanding              PT Short Term Goals - 07/16/21 0902       PT SHORT TERM GOAL #1   Title Pt will be independent with HEP in order to improve strength and decrease pain in order to improve pain-free function at home and work.    Baseline 07/16/21 HEP given    Time 4    Period Weeks    Status New               PT Long Term Goals - 07/16/21 1005       PT LONG TERM GOAL #1   Title Patient will increase FOTO score to 72 to demonstrate predicted increase in functional mobility to complete ADLs    Baseline 07/16/21 47    Time 8    Period Weeks    Status New      PT LONG TERM GOAL #2   Title Pt will decrease worst pain as reported on NPRS by at least 3 points in order to demonstrate clinically significant reduction in pain.    Baseline 07/16/21 3/10    Time 8    Period Weeks    Status New      PT LONG TERM GOAL #3   Title Pt will increase gross shoulder and periscapular strength to at least 4+/5 MMT grade in order to demonstrate improvement in strength and function needed for heavy household ADLs    Baseline 07/16/21 unable to perform break test 3/5 at least for all    Time 8    Period Weeks    Status New      PT LONG TERM GOAL #4   Title Pt will  demonstrate full active  shoulder ROM without scapular dyskenesis in order to demonstrate mobility needed for ADLs    Baseline 07/16/21 Flex and abd "full" with poor scapulohumeral rhythm approx 90d of true flex and abd; IR L1    Time 8    Period Weeks    Status New                   Plan - 08/04/21 9628     Clinical Impression Statement PT continued therex progression with focus of strengthening per 6 week protocol progression. Paitent is able to comply with all cuing for proper technique of therex, with good motivation and no increased pain throughout session. Patient able to demonstrate and verbalize understanding of HEP. PT will continue progression as able.    Personal Factors and Comorbidities Comorbidity 2;Fitness;Time since onset of injury/illness/exacerbation    Comorbidities HLD, GERD    Examination-Activity Limitations Carry;Lift;Dressing;Reach Overhead;Bathing;Sleep    Examination-Participation Restrictions Cleaning;Laundry;Community Activity;Meal Prep;Occupation    Stability/Clinical Decision Making Evolving/Moderate complexity    Clinical Decision Making Moderate    Rehab Potential Good    PT Frequency 2x / week    PT Duration 8 weeks    PT Treatment/Interventions ADLs/Self Care Home Management;Cryotherapy;Fluidtherapy;Iontophoresis 4mg /ml Dexamethasone;Functional mobility training;Therapeutic exercise;Passive range of motion;Spinal Manipulations;Joint Manipulations;Dry needling;Traction;Ultrasound;DME Instruction;Moist Heat;Electrical Stimulation;Therapeutic activities;Neuromuscular re-education;Patient/family education;Manual techniques;Taping    PT Next Visit Plan HEP review, scapulohumeral rhythm    PT Home Exercise Plan supine shoulder flex and abd AAROM with focus on scapulohumeral rhythm, levator and UT stretch    Consulted and Agree with Plan of Care Patient             Patient will benefit from skilled therapeutic intervention in order to improve the  following deficits and impairments:  Decreased activity tolerance, Decreased endurance, Decreased range of motion, Decreased strength, Increased fascial restricitons, Impaired UE functional use, Postural dysfunction, Pain, Decreased mobility, Impaired flexibility, Impaired tone  Visit Diagnosis: Chronic left shoulder pain     Problem List Patient Active Problem List   Diagnosis Date Noted   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 [place], [address] Phone: [phone]   Fax:  [phone]  Name: Cynthia Obrien MRN: [id number] Date of Birth: 12-Jul-1966

## 2021-08-06 ENCOUNTER — Ambulatory Visit: Payer: BC Managed Care – PPO | Admitting: Physical Therapy

## 2021-08-06 ENCOUNTER — Other Ambulatory Visit: Payer: Self-pay

## 2021-08-06 ENCOUNTER — Encounter: Payer: Self-pay | Admitting: Physical Therapy

## 2021-08-06 DIAGNOSIS — M25512 Pain in left shoulder: Secondary | ICD-10-CM

## 2021-08-06 NOTE — Therapy (Signed)
[facility] [address], [place], [address] Phone: [phone]   Fax:  [phone]  Physical Therapy Treatment  Patient Details  Name: Cynthia Obrien MRN: [id number] Date of Birth: 03-13-1966 Referring Provider (PT): Gloriann Loan [place]   Encounter Date: 08/06/2021   PT End of Session - 08/06/21 0754     Visit Number 5    Number of Visits 17    Date for PT Re-Evaluation 09/12/21    Authorization - Visit Number 5    Authorization - Number of Visits 10    PT Start Time 0732    PT Stop Time 0810    PT Time Calculation (min) 38 min    Activity Tolerance Patient tolerated treatment well    Behavior During Therapy [facility] for tasks assessed/performed             Past Medical History:  Diagnosis Date   Arthritis    psoriac   COPD (chronic obstructive pulmonary disease) ([place])    Dysplastic nevus 02/13/2020   right inguinal crease, mild atypia    Fatigue    Hyperlipidemia    Hypothyroidism    Joint ache    Lower extremity edema    Psoriasis    SVT (supraventricular tachycardia) ([place])    a. 10/2011 Echo: EF 60-65%, Gr 2 DD.   Tuberculosis    patient denies this dx    Past Surgical History:  Procedure Laterality Date   ANKLE SURGERY     COLONOSCOPY     ENDOMETRIAL ABLATION     LAPAROSCOPIC GASTRIC BYPASS  01/2018   LUMBAR DISC SURGERY     SHOULDER ARTHROSCOPY Left 06/24/2021   Procedure: LEFT ARTHROSCOPY SHOULDER, DEBRIDEMENT, BICEPS TENODESIS;  Surgeon: Meredith Pel, MD;  Location: [place];  Service: Orthopedics;  Laterality: Left;   tonsillectomy      There were no vitals filed for this visit.   Subjective Assessment - 08/06/21 0746     Subjective Pt reports no pain, popping/clicking. Doing well overall- completing HEP. Nothing new to report,    Pertinent History Pt is a 56 year old female presenting with L bicep tenodeis with debridement 06/24/21 [name]. Patient with history of  chronic L shoulder pain, cleaned out her garage and it was worse, and went to orthopedist who diagnosed her with SLAP tear, advised surgery. Following surgery patient reports much less pain and good mobility. Current pain 1/10 worst 3/10; best 0/10. Patient reports most of her pain is in her upper arm at her bicep, more so than the shoulder, comes on quick with a sharp catch when she moves her arm laterally away from her body, and (though she remarks she is not supposed to) when she reaches behind her back on accident. Post op resistrictions no lifting or ext for 6 weeks. 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 Her post op restrictions are not currently limiting her at work, but more so in her ADLs (unable to complete heavy household cleaning, modifying bathing/dressing/feeding), and her normal weight lifting regimen. She completes a weight lifting regimen with the help of a personal trainer 4days per week, and water aerobics 2x/week. Pt denies N/V, B&B changes, unexplained weight fluctuation, saddle paresthesia, fever, night sweats, or unrelenting night pain at this time.    Limitations Lifting;House hold activities;Reading    How long can  you sit comfortably? unlimited    How long can you stand comfortably? unlimited    How long can you walk comfortably? unlimited    Patient Stated Goals None since surgery    Pain Onset More than a month ago              Ther-Ex UBE 65mins FWD 72min BWD seat 5 L5 for gentle strengthening Standing rows blackTB 3x 10 (blueTB with ease) with min cuing for eccentric control Standing bilat shoulder ext BlackTB from elevation 3x 10 with good carry over of set up demo and cuing Hooklying chest press 6# DB bilat with min cuing for eccentric control with good carry over Supine skull crushers 6# 3x 10/9/8 with min cuing for set up with noted fatigue with this exercise Seated bicep curl 5# to  just thighs (about 75% full range) 3x 10 with min cuing for eccentric control  AAROM wall slide with forward step x12 3sec hold Post shoulder rolls with focus on scapular retraction + depression x15                      PT Education - 08/06/21 0754     Education Details therex form/technique    Person(s) Educated Patient    Methods Explanation;Demonstration;Verbal cues    Comprehension Verbalized understanding;Returned demonstration;Verbal cues required              PT Short Term Goals - 07/16/21 0902       PT SHORT TERM GOAL #1   Title Pt will be independent with HEP in order to improve strength and decrease pain in order to improve pain-free function at home and work.    Baseline 07/16/21 HEP given    Time 4    Period Weeks    Status New               PT Long Term Goals - 07/16/21 1005       PT LONG TERM GOAL #1   Title Patient will increase FOTO score to 72 to demonstrate predicted increase in functional mobility to complete ADLs    Baseline 07/16/21 47    Time 8    Period Weeks    Status New      PT LONG TERM GOAL #2   Title Pt will decrease worst pain as reported on NPRS by at least 3 points in order to demonstrate clinically significant reduction in pain.    Baseline 07/16/21 3/10    Time 8    Period Weeks    Status New      PT LONG TERM GOAL #3   Title Pt will increase gross shoulder and periscapular strength to at least 4+/5 MMT grade in order to demonstrate improvement in strength and function needed for heavy household ADLs    Baseline 07/16/21 unable to perform break test 3/5 at least for all    Time 8    Period Weeks    Status New      PT LONG TERM GOAL #4   Title Pt will demonstrate full active shoulder ROM without scapular dyskenesis in order to demonstrate mobility needed for ADLs    Baseline 07/16/21 Flex and abd "full" with poor scapulohumeral rhythm approx 90d of true flex and abd; IR L1    Time 8    Period Weeks    Status New                    Plan -  08/06/21 0759     Clinical Impression Statement PT continued therex progression with strengthening focus with success. PT progressed resistance, which patient is able to tolerate with no increased pain, and comply with multimodal cuing for proper technique of therex. Pt with 1 instance of pain free popping sensation. Pt is motivated throughout session, with understanding of HEP updates from last session. PT will continue progressoin as able.    Personal Factors and Comorbidities Comorbidity 2;Fitness;Time since onset of injury/illness/exacerbation    Comorbidities HLD, GERD    Examination-Activity Limitations Carry;Lift;Dressing;Reach Overhead;Bathing;Sleep    Examination-Participation Restrictions Cleaning;Laundry;Community Activity;Meal Prep;Occupation    Stability/Clinical Decision Making Evolving/Moderate complexity    Clinical Decision Making Moderate    Rehab Potential Good    PT Frequency 2x / week    PT Duration 8 weeks    PT Treatment/Interventions ADLs/Self Care Home Management;Cryotherapy;Fluidtherapy;Iontophoresis 4mg /ml Dexamethasone;Functional mobility training;Therapeutic exercise;Passive range of motion;Spinal Manipulations;Joint Manipulations;Dry needling;Traction;Ultrasound;DME Instruction;Moist Heat;Electrical Stimulation;Therapeutic activities;Neuromuscular re-education;Patient/family education;Manual techniques;Taping    PT Next Visit Plan HEP review, scapulohumeral rhythm    PT Home Exercise Plan supine shoulder flex and abd AAROM with focus on scapulohumeral rhythm, levator and UT stretch    Consulted and Agree with Plan of Care Patient             Patient will benefit from skilled therapeutic intervention in order to improve the following deficits and impairments:  Decreased activity tolerance, Decreased endurance, Decreased range of motion, Decreased strength, Increased fascial restricitons, Impaired UE functional use, Postural  dysfunction, Pain, Decreased mobility, Impaired flexibility, Impaired tone  Visit Diagnosis: Chronic left shoulder pain     Problem List Patient Active Problem List   Diagnosis Date Noted   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 [place], [address] Phone: [phone]   Fax:  [phone]  Name: Cynthia Obrien MRN: [id number] Date of Birth: [birth year]

## 2021-08-11 ENCOUNTER — Other Ambulatory Visit: Payer: Self-pay

## 2021-08-11 ENCOUNTER — Encounter: Payer: Self-pay | Admitting: Physical Therapy

## 2021-08-11 ENCOUNTER — Ambulatory Visit: Payer: BC Managed Care – PPO | Admitting: Physical Therapy

## 2021-08-11 ENCOUNTER — Encounter: Payer: BC Managed Care – PPO | Admitting: Physical Therapy

## 2021-08-11 DIAGNOSIS — G8929 Other chronic pain: Secondary | ICD-10-CM

## 2021-08-11 DIAGNOSIS — M25512 Pain in left shoulder: Secondary | ICD-10-CM | POA: Diagnosis not present

## 2021-08-11 NOTE — Therapy (Signed)
[facility] [address], [place], [address] Phone: [phone]   Fax:  [phone]  Physical Therapy Treatment  Patient Details  Name: Cynthia Obrien MRN: [id number] Date of Birth: [birth year] Referring Provider (PT): [place], Juanda Crumble [place]   Encounter Date: 08/11/2021    Past Medical History:  Diagnosis Date   Arthritis    psoriac   COPD (chronic obstructive pulmonary disease) ([place])    Dysplastic nevus 02/13/2020   right inguinal crease, mild atypia    Fatigue    Hyperlipidemia    Hypothyroidism    Joint ache    Lower extremity edema    Psoriasis    SVT (supraventricular tachycardia) ([place])    a. 10/2011 Echo: EF 60-65%, Gr 2 DD.   Tuberculosis    patient denies this dx    Past Surgical History:  Procedure Laterality Date   ANKLE SURGERY     COLONOSCOPY     ENDOMETRIAL ABLATION     LAPAROSCOPIC GASTRIC BYPASS  01/2018   LUMBAR DISC SURGERY     SHOULDER ARTHROSCOPY Left 06/24/2021   Procedure: LEFT ARTHROSCOPY SHOULDER, DEBRIDEMENT, BICEPS TENODESIS;  Surgeon: Meredith Pel, MD;  Location: [facility];  Service: Orthopedics;  Laterality: Left;   tonsillectomy      There were no vitals filed for this visit.      Ther-Ex UBE 22mins FWD 54min BWD seat 5 L5 for gentle strengthening Standing rows 15# 3x 10 with good carry over of initial cuing for technique without shoulder hiking Lat pulldown 25# 3x 10/10/9 with cuing for scapular retraction + depression with good carry over Seated forward press (neutral chest press) OMEGA 15# 3x 10 with good carry over of set up cuing Seated tricep ext 5# 3x 10/9/8 with min cuing for set up with noted fatigue with this exercise Standing hammer curl 5# x10; with eccentric focus 2x 10 AAROM wall slide with forward step x12 3sec hold Post shoulder rolls with focus on scapular retraction + depression x15                           PT Short  Term Goals - 07/16/21 0902       PT SHORT TERM GOAL #1   Title Pt will be independent with HEP in order to improve strength and decrease pain in order to improve pain-free function at home and work.    Baseline 07/16/21 HEP given    Time 4    Period Weeks    Status New               PT Long Term Goals - 07/16/21 1005       PT LONG TERM GOAL #1   Title Patient will increase FOTO score to 72 to demonstrate predicted increase in functional mobility to complete ADLs    Baseline 07/16/21 47    Time 8    Period Weeks    Status New      PT LONG TERM GOAL #2   Title Pt will decrease worst pain as reported on NPRS by at least 3 points in order to demonstrate clinically significant reduction in pain.    Baseline 07/16/21 3/10    Time 8    Period Weeks    Status New      PT LONG TERM GOAL #3   Title Pt will increase gross shoulder and periscapular strength to at least 4+/5 MMT grade  in order to demonstrate improvement in strength and function needed for heavy household ADLs    Baseline 07/16/21 unable to perform break test 3/5 at least for all    Time 8    Period Weeks    Status New      PT LONG TERM GOAL #4   Title Pt will demonstrate full active shoulder ROM without scapular dyskenesis in order to demonstrate mobility needed for ADLs    Baseline 07/16/21 Flex and abd "full" with poor scapulohumeral rhythm approx 90d of true flex and abd; IR L1    Time 8    Period Weeks    Status New                    Patient will benefit from skilled therapeutic intervention in order to improve the following deficits and impairments:     Visit Diagnosis: No diagnosis found.     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, [place], [address] Phone: [phone]   Fax:  [phone]  Name: Cynthia Obrien MRN: [id number] Date of Birth: 1966-04-13

## 2021-08-13 ENCOUNTER — Ambulatory Visit: Payer: BC Managed Care – PPO | Admitting: Physical Therapy

## 2021-08-14 ENCOUNTER — Encounter: Payer: Self-pay | Admitting: Physical Therapy

## 2021-08-14 ENCOUNTER — Other Ambulatory Visit: Payer: Self-pay

## 2021-08-14 ENCOUNTER — Ambulatory Visit: Payer: BC Managed Care – PPO | Attending: Surgical | Admitting: Physical Therapy

## 2021-08-14 DIAGNOSIS — M25512 Pain in left shoulder: Secondary | ICD-10-CM | POA: Insufficient documentation

## 2021-08-14 DIAGNOSIS — G8929 Other chronic pain: Secondary | ICD-10-CM | POA: Diagnosis present

## 2021-08-14 NOTE — Therapy (Signed)
[facility] [address], [place], [address] Phone: [phone]   Fax:  [phone]  Physical Therapy Treatment  Patient Details  Name: Cynthia Obrien MRN: [id number] Date of Birth: [birth year] Referring Provider (PT): Cynthia Obrien   Encounter Date: 08/14/2021   PT End of Session - 08/14/21 0744     Visit Number 7    Number of Visits 17    Date for PT Re-Evaluation 09/12/21    Authorization - Visit Number 7    Authorization - Number of Visits 10    PT Start Time 0733    PT Stop Time 0811    PT Time Calculation (min) 38 min    Activity Tolerance Patient tolerated treatment well    Behavior During Therapy [facility] for tasks assessed/performed             Past Medical History:  Diagnosis Date   Arthritis    psoriac   COPD (chronic obstructive pulmonary disease) ([place])    Dysplastic nevus 02/13/2020   right inguinal crease, mild atypia    Fatigue    Hyperlipidemia    Hypothyroidism    Joint ache    Lower extremity edema    Psoriasis    SVT (supraventricular tachycardia) ([place])    a. 10/2011 Echo: EF 60-65%, Gr 2 DD.   Tuberculosis    patient denies this dx    Past Surgical History:  Procedure Laterality Date   ANKLE SURGERY     COLONOSCOPY     ENDOMETRIAL ABLATION     LAPAROSCOPIC GASTRIC BYPASS  01/2018   LUMBAR DISC SURGERY     SHOULDER ARTHROSCOPY Left 06/24/2021   Procedure: LEFT ARTHROSCOPY SHOULDER, DEBRIDEMENT, BICEPS TENODESIS;  Surgeon: Meredith Pel, MD;  Location: [facility];  Service: Orthopedics;  Laterality: Left;   tonsillectomy      There were no vitals filed for this visit.   Subjective Assessment - 08/14/21 0737     Subjective Pt reports some increased increased soreness following last session. She worked out following last session as well. Pt reports she is having no pain popping/clicking. Completing HEP.    Pertinent History Pt is a 56 year old female presenting  with L bicep tenodeis with debridement 06/24/21 Cynthia Obrien. Patient with history of chronic L shoulder pain, cleaned out her garage and it was worse, and went to orthopedist who diagnosed her with SLAP tear, advised surgery. Following surgery patient reports much less pain and good mobility. Current pain 1/10 worst 3/10; best 0/10. Patient reports most of her pain is in her upper arm at her bicep, more so than the shoulder, comes on quick with a sharp catch when she moves her arm laterally away from her body, and (though she remarks she is not supposed to) when she reaches behind her back on accident. Post op resistrictions no lifting or ext for 6 weeks. 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 Her post op restrictions are not currently limiting her at work, but more so in her ADLs (unable to complete heavy household cleaning, modifying bathing/dressing/feeding), and her normal weight lifting regimen. She completes a weight lifting regimen with the help of a personal trainer 4days per week, and water aerobics 2x/week. Pt denies N/V, B&B changes, unexplained weight fluctuation, saddle paresthesia, fever, night sweats, or unrelenting night pain at this time.  Limitations Lifting;House hold activities;Reading    How long can you sit comfortably? unlimited    How long can you stand comfortably? unlimited    How long can you walk comfortably? unlimited    Patient Stated Goals None since surgery    Pain Onset More than a month ago              Ther-Ex UBE 2mins FWD 46min BWD seat 5 L5 for gentle strengthening Standing rows 15# 3x 10 with good carry over of initial cuing for technique without shoulder hiking Lat pulldown 25# 3x 10 with cuing for scapular retraction + depression with good carry over Seated overhead press 4# x6; 3# 2x 8 with min cuing for technique with good carry over Standing bilat tricep pushdown 5# 3x 10  with min cuing for set up with good carry over Standing hammer curl 5# x10; with eccentric focus 2x 10 Post shoulder rolls with focus on scapular retraction + depression x15                              PT Education - 08/14/21 0742     Education Details therex form/technique    Person(s) Educated Patient    Methods Explanation;Demonstration;Verbal cues    Comprehension Verbalized understanding;Returned demonstration;Verbal cues required              PT Short Term Goals - 07/16/21 0902       PT SHORT TERM GOAL #1   Title Pt will be independent with HEP in order to improve strength and decrease pain in order to improve pain-free function at home and work.    Baseline 07/16/21 HEP given    Time 4    Period Weeks    Status New               PT Long Term Goals - 07/16/21 1005       PT LONG TERM GOAL #1   Title Patient will increase FOTO score to 72 to demonstrate predicted increase in functional mobility to complete ADLs    Baseline 07/16/21 47    Time 8    Period Weeks    Status New      PT LONG TERM GOAL #2   Title Pt will decrease worst pain as reported on NPRS by at least 3 points in order to demonstrate clinically significant reduction in pain.    Baseline 07/16/21 3/10    Time 8    Period Weeks    Status New      PT LONG TERM GOAL #3   Title Pt will increase gross shoulder and periscapular strength to at least 4+/5 MMT grade in order to demonstrate improvement in strength and function needed for heavy household ADLs    Baseline 07/16/21 unable to perform break test 3/5 at least for all    Time 8    Period Weeks    Status New      PT LONG TERM GOAL #4   Title Pt will demonstrate full active shoulder ROM without scapular dyskenesis in order to demonstrate mobility needed for ADLs    Baseline 07/16/21 Flex and abd "full" with poor scapulohumeral rhythm approx 90d of true flex and abd; IR L1    Time 8    Period Weeks    Status New                    Plan -  08/14/21 1602     Clinical Impression Statement PT continued therex progression with strengthening focus, with continued focus on therex technique and eccentric control with success. Paitent is motivated throughout session, with no increased pain, occassional painless popping, subsides with decreasing weight resistance. Patient is able to comply with all cuing for proper technique with excellent technique. PT will continue progression as able.    Personal Factors and Comorbidities Comorbidity 2;Fitness;Time since onset of injury/illness/exacerbation    Comorbidities HLD, GERD    Examination-Activity Limitations Carry;Lift;Dressing;Reach Overhead;Bathing;Sleep    Examination-Participation Restrictions Cleaning;Laundry;Community Activity;Meal Prep;Occupation    Stability/Clinical Decision Making Evolving/Moderate complexity    Clinical Decision Making Moderate    Rehab Potential Good    PT Frequency 2x / week    PT Duration 8 weeks    PT Treatment/Interventions ADLs/Self Care Home Management;Cryotherapy;Fluidtherapy;Iontophoresis 4mg /ml Dexamethasone;Functional mobility training;Therapeutic exercise;Passive range of motion;Spinal Manipulations;Joint Manipulations;Dry needling;Traction;Ultrasound;DME Instruction;Moist Heat;Electrical Stimulation;Therapeutic activities;Neuromuscular re-education;Patient/family education;Manual techniques;Taping    PT Next Visit Plan continue POC progression    PT Home Exercise Plan supine shoulder flex and abd AAROM with focus on scapulohumeral rhythm, levator and UT stretch    Consulted and Agree with Plan of Care Patient             Patient will benefit from skilled therapeutic intervention in order to improve the following deficits and impairments:  Decreased activity tolerance, Decreased endurance, Decreased range of motion, Decreased strength, Increased fascial restricitons, Impaired UE functional use, Postural dysfunction, Pain,  Decreased mobility, Impaired flexibility, Impaired tone  Visit Diagnosis: Chronic left shoulder pain     Problem List Patient Active Problem List   Diagnosis Date Noted   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, [place], [address] Phone: [phone]   Fax:  [phone]  Name: Cynthia Obrien MRN: [id number] Date of Birth: [birth year]

## 2021-08-18 ENCOUNTER — Ambulatory Visit: Payer: BC Managed Care – PPO | Admitting: Physical Therapy

## 2021-08-18 ENCOUNTER — Encounter: Payer: Self-pay | Admitting: Physical Therapy

## 2021-08-18 DIAGNOSIS — M25512 Pain in left shoulder: Secondary | ICD-10-CM | POA: Diagnosis not present

## 2021-08-18 DIAGNOSIS — G8929 Other chronic pain: Secondary | ICD-10-CM

## 2021-08-18 NOTE — Therapy (Signed)
[facility] [address], [place], [address] Phone: [phone]   Fax:  [phone]  Physical Therapy Treatment  Patient Details  Name: Cynthia Obrien MRN: [id number] Date of Birth: 1965/11/15 Referring Provider (PT): Cynthia Obrien [place]   Encounter Date: 08/18/2021   PT End of Session - 08/18/21 0813     Visit Number 8    Number of Visits 17    Date for PT Re-Evaluation 09/12/21    Authorization - Visit Number 8    Authorization - Number of Visits 10    PT Start Time 0733    PT Stop Time 0815    PT Time Calculation (min) 42 min    Activity Tolerance Patient tolerated treatment well    Behavior During Therapy [facility] for tasks assessed/performed             Past Medical History:  Diagnosis Date   Arthritis    psoriac   COPD (chronic obstructive pulmonary disease) ([place])    Dysplastic nevus 02/13/2020   right inguinal crease, mild atypia    Fatigue    Hyperlipidemia    Hypothyroidism    Joint ache    Lower extremity edema    Psoriasis    SVT (supraventricular tachycardia) ([place])    a. 10/2011 Echo: EF 60-65%, Gr 2 DD.   Tuberculosis    patient denies this dx    Past Surgical History:  Procedure Laterality Date   ANKLE SURGERY     COLONOSCOPY     ENDOMETRIAL ABLATION     LAPAROSCOPIC GASTRIC BYPASS  01/2018   LUMBAR DISC SURGERY     SHOULDER ARTHROSCOPY Left 06/24/2021   Procedure: LEFT ARTHROSCOPY SHOULDER, DEBRIDEMENT, BICEPS TENODESIS;  Surgeon: Cynthia Pel, MD;  Location: [facility];  Service: Orthopedics;  Laterality: Left;   tonsillectomy      There were no vitals filed for this visit.   Subjective Assessment - 08/18/21 0805     Subjective Pt reports having no pain today. Reports no popping/clicking since last visit. Doing well overall, completing HEP.    Pertinent History Pt is a 56 year old female presenting with L bicep tenodeis with debridement 06/24/21 Cynthia Obrien.  Patient with history of chronic L shoulder pain, cleaned out her garage and it was worse, and went to orthopedist who diagnosed her with SLAP tear, advised surgery. Following surgery patient reports much less pain and good mobility. Current pain 1/10 worst 3/10; best 0/10. Patient reports most of her pain is in her upper arm at her bicep, more so than the shoulder, comes on quick with a sharp catch when she moves her arm laterally away from her body, and (though she remarks she is not supposed to) when she reaches behind her back on accident. Post op resistrictions no lifting or ext for 6 weeks. 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 Her post op restrictions are not currently limiting her at work, but more so in her ADLs (unable to complete heavy household cleaning, modifying bathing/dressing/feeding), and her normal weight lifting regimen. She completes a weight lifting regimen with the help of a personal trainer 4days per week, and water aerobics 2x/week. Pt denies N/V, B&B changes, unexplained weight fluctuation, saddle paresthesia, fever, night sweats, or unrelenting night pain at this time.    Limitations Lifting;House hold activities;Reading  How long can you sit comfortably? unlimited    How long can you stand comfortably? unlimited    How long can you walk comfortably? unlimited    Patient Stated Goals None since surgery    Pain Onset More than a month ago              Ther-Ex UBE 31mins FWD 82min BWD seat 5 L5 for gentle strengthening Single arm tanding rows 5# 3x 10 with good carry over of initial cuing for technique without shoulder hiking ER and IR GTB 2x 10 each with cuing for technique without compensation and eccentric control with good carry over Bent over rows with 5# x12; 7# 2x10 with supinated grip, cuing for set up posture with good carry over Scaption full can 3# DB bilat 3x 10 with min cuing for  technique with good carry over Standing hammer curl 7# 2 x10/8; with eccentric focus 2x 10 Post shoulder rolls with focus on scapular retraction + depression x15                                    PT Education - 08/18/21 0812     Education Details therex form/technique    Person(s) Educated Patient    Methods Explanation;Demonstration;Verbal cues    Comprehension Verbalized understanding;Verbal cues required;Returned demonstration              PT Short Term Goals - 07/16/21 0902       PT SHORT TERM GOAL #1   Title Pt will be independent with HEP in order to improve strength and decrease pain in order to improve pain-free function at home and work.    Baseline 07/16/21 HEP given    Time 4    Period Weeks    Status New               PT Long Term Goals - 07/16/21 1005       PT LONG TERM GOAL #1   Title Patient will increase FOTO score to 72 to demonstrate predicted increase in functional mobility to complete ADLs    Baseline 07/16/21 47    Time 8    Period Weeks    Status New      PT LONG TERM GOAL #2   Title Pt will decrease worst pain as reported on NPRS by at least 3 points in order to demonstrate clinically significant reduction in pain.    Baseline 07/16/21 3/10    Time 8    Period Weeks    Status New      PT LONG TERM GOAL #3   Title Pt will increase gross shoulder and periscapular strength to at least 4+/5 MMT grade in order to demonstrate improvement in strength and function needed for heavy household ADLs    Baseline 07/16/21 unable to perform break test 3/5 at least for all    Time 8    Period Weeks    Status New      PT LONG TERM GOAL #4   Title Pt will demonstrate full active shoulder ROM without scapular dyskenesis in order to demonstrate mobility needed for ADLs    Baseline 07/16/21 Flex and abd "full" with poor scapulohumeral rhythm approx 90d of true flex and abd; IR L1    Time 8    Period Weeks    Status New  Plan - 08/18/21 0908     Clinical Impression Statement PT continued therex progression with progression of resisted ER/IR with success. Paitent reports no increased pain, popping/clicking sensations throughout session. Patient is able to comply with all cuing for proper technique of therex with good motivation throughout session. PT will continue progression as able.    Personal Factors and Comorbidities Comorbidity 2;Fitness;Time since onset of injury/illness/exacerbation    Comorbidities HLD, GERD    Examination-Activity Limitations Carry;Lift;Dressing;Reach Overhead;Bathing;Sleep    Examination-Participation Restrictions Cleaning;Laundry;Community Activity;Meal Prep;Occupation    Stability/Clinical Decision Making Evolving/Moderate complexity    Clinical Decision Making Moderate    Rehab Potential Good    PT Frequency 2x / week    PT Duration 8 weeks    PT Treatment/Interventions ADLs/Self Care Home Management;Cryotherapy;Fluidtherapy;Iontophoresis 4mg /ml Dexamethasone;Functional mobility training;Therapeutic exercise;Passive range of motion;Spinal Manipulations;Joint Manipulations;Dry needling;Traction;Ultrasound;DME Instruction;Moist Heat;Electrical Stimulation;Therapeutic activities;Neuromuscular re-education;Patient/family education;Manual techniques;Taping    PT Next Visit Plan continue POC progression    PT Home Exercise Plan band ER/IR, bicep curls, rows    Consulted and Agree with Plan of Care Patient             Patient will benefit from skilled therapeutic intervention in order to improve the following deficits and impairments:  Decreased activity tolerance, Decreased endurance, Decreased range of motion, Decreased strength, Increased fascial restricitons, Impaired UE functional use, Postural dysfunction, Pain, Decreased mobility, Impaired flexibility, Impaired tone  Visit Diagnosis: Chronic left shoulder pain     Problem List Patient Active Problem List    Diagnosis Date Noted   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 [place], [address] Phone: [phone]   Fax:  [phone]  Name: Cynthia Obrien MRN: [id number] Date of Birth: 1966-05-12

## 2021-08-20 ENCOUNTER — Ambulatory Visit: Payer: BC Managed Care – PPO | Admitting: Physical Therapy

## 2021-08-20 ENCOUNTER — Other Ambulatory Visit: Payer: Self-pay

## 2021-08-20 ENCOUNTER — Encounter: Payer: Self-pay | Admitting: Physical Therapy

## 2021-08-20 DIAGNOSIS — M25512 Pain in left shoulder: Secondary | ICD-10-CM | POA: Diagnosis not present

## 2021-08-20 DIAGNOSIS — G8929 Other chronic pain: Secondary | ICD-10-CM

## 2021-08-20 NOTE — Therapy (Signed)
[facility] [address], [place], [address] Phone: [phone]   Fax:  [phone]  Physical Therapy Treatment  Patient Details  Name: Cynthia Obrien MRN: [id number] Date of Birth: 1966-04-21 Referring Provider (PT): Gloriann Loan [place]   Encounter Date: 08/20/2021   PT End of Session - 08/20/21 0758     Visit Number 9    Number of Visits 17    Date for PT Re-Evaluation 09/12/21    Authorization - Visit Number 9    Authorization - Number of Visits 10    PT Start Time 0732    PT Stop Time 0810    PT Time Calculation (min) 38 min    Activity Tolerance Patient tolerated treatment well    Behavior During Therapy [facility] for tasks assessed/performed             Past Medical History:  Diagnosis Date   Arthritis    psoriac   COPD (chronic obstructive pulmonary disease) ([place])    Dysplastic nevus 02/13/2020   right inguinal crease, mild atypia    Fatigue    Hyperlipidemia    Hypothyroidism    Joint ache    Lower extremity edema    Psoriasis    SVT (supraventricular tachycardia) ([place])    a. 10/2011 Echo: EF 60-65%, Gr 2 DD.   Tuberculosis    patient denies this dx    Past Surgical History:  Procedure Laterality Date   ANKLE SURGERY     COLONOSCOPY     ENDOMETRIAL ABLATION     LAPAROSCOPIC GASTRIC BYPASS  01/2018   LUMBAR DISC SURGERY     SHOULDER ARTHROSCOPY Left 06/24/2021   Procedure: LEFT ARTHROSCOPY SHOULDER, DEBRIDEMENT, BICEPS TENODESIS;  Surgeon: Meredith Pel, MD;  Location: [facility];  Service: Orthopedics;  Laterality: Left;   tonsillectomy      There were no vitals filed for this visit.   Subjective Assessment - 08/20/21 0737     Subjective Pt doing well overall, no pain today. No popping/clicking in shoulder. Some post exercise soreness following last session for 24 hours.    Pertinent History Pt is a 56 year old female presenting with L bicep tenodeis with debridement 06/24/21  [name]. Patient with history of chronic L shoulder pain, cleaned out her garage and it was worse, and went to orthopedist who diagnosed her with SLAP tear, advised surgery. Following surgery patient reports much less pain and good mobility. Current pain 1/10 worst 3/10; best 0/10. Patient reports most of her pain is in her upper arm at her bicep, more so than the shoulder, comes on quick with a sharp catch when she moves her arm laterally away from her body, and (though she remarks she is not supposed to) when she reaches behind her back on accident. Post op resistrictions no lifting or ext for 6 weeks. 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 Her post op restrictions are not currently limiting her at work, but more so in her ADLs (unable to complete heavy household cleaning, modifying bathing/dressing/feeding), and her normal weight lifting regimen. She completes a weight lifting regimen with the help of a personal trainer 4days per week, and water aerobics 2x/week. Pt denies N/V, B&B changes, unexplained weight fluctuation, saddle paresthesia, fever, night sweats, or unrelenting night pain at this time.    Limitations Lifting;House hold  activities;Reading    How long can you sit comfortably? unlimited    How long can you stand comfortably? unlimited    How long can you walk comfortably? unlimited    Patient Stated Goals None since surgery    Pain Onset More than a month ago             Ther-Ex UBE 41mins FWD 88min BWD seat 5 L5 for gentle strengthening Single arm tanding rows 5# 2x 10 with good carry over of initial cuing for technique without shoulder hiking ER and IR BluTB 3x 10 with min cuing for posture with good carry over Bent over rows with 8 # 2x10 with supinated grip, cuing for set up posture with good carry over Standing hammer cu rl supinated 10# 3x 6 with min cuing for eccentric focus with good carry  over Post shoulder rolls with focus on scapular retraction + depression x15                           PT Education - 08/20/21 0748     Education Details therex form/technique    Person(s) Educated Patient    Methods Explanation;Demonstration;Verbal cues    Comprehension Verbalized understanding;Returned demonstration;Verbal cues required              PT Short Term Goals - 07/16/21 0902       PT SHORT TERM GOAL #1   Title Pt will be independent with HEP in order to improve strength and decrease pain in order to improve pain-free function at home and work.    Baseline 07/16/21 HEP given    Time 4    Period Weeks    Status New               PT Long Term Goals - 07/16/21 1005       PT LONG TERM GOAL #1   Title Patient will increase FOTO score to 72 to demonstrate predicted increase in functional mobility to complete ADLs    Baseline 07/16/21 47    Time 8    Period Weeks    Status New      PT LONG TERM GOAL #2   Title Pt will decrease worst pain as reported on NPRS by at least 3 points in order to demonstrate clinically significant reduction in pain.    Baseline 07/16/21 3/10    Time 8    Period Weeks    Status New      PT LONG TERM GOAL #3   Title Pt will increase gross shoulder and periscapular strength to at least 4+/5 MMT grade in order to demonstrate improvement in strength and function needed for heavy household ADLs    Baseline 07/16/21 unable to perform break test 3/5 at least for all    Time 8    Period Weeks    Status New      PT LONG TERM GOAL #4   Title Pt will demonstrate full active shoulder ROM without scapular dyskenesis in order to demonstrate mobility needed for ADLs    Baseline 07/16/21 Flex and abd "full" with poor scapulohumeral rhythm approx 90d of true flex and abd; IR L1    Time 8    Period Weeks    Status New                   Plan - 08/20/21 0815     Clinical Impression Statement PT continued therex  progression  with success. Patient is able to comply with all cuing for proper technique of therex, with no increased pain or popping/clicking throughout session. Pt is very motivated throughout session. PT will reassess progress next visit per 10th visit compliance. PT will continue progression as able.    Personal Factors and Comorbidities Comorbidity 2;Fitness;Time since onset of injury/illness/exacerbation    Comorbidities HLD, GERD    Examination-Activity Limitations Carry;Lift;Dressing;Reach Overhead;Bathing;Sleep    Examination-Participation Restrictions Cleaning;Laundry;Community Activity;Meal Prep;Occupation    Stability/Clinical Decision Making Evolving/Moderate complexity    Clinical Decision Making Moderate    Rehab Potential Good    PT Frequency 2x / week    PT Duration 8 weeks    PT Treatment/Interventions ADLs/Self Care Home Management;Cryotherapy;Fluidtherapy;Iontophoresis 4mg /ml Dexamethasone;Functional mobility training;Therapeutic exercise;Passive range of motion;Spinal Manipulations;Joint Manipulations;Dry needling;Traction;Ultrasound;DME Instruction;Moist Heat;Electrical Stimulation;Therapeutic activities;Neuromuscular re-education;Patient/family education;Manual techniques;Taping    PT Next Visit Plan continue POC progression    PT Home Exercise Plan band ER/IR, bicep curls, rows    Consulted and Agree with Plan of Care Patient             Patient will benefit from skilled therapeutic intervention in order to improve the following deficits and impairments:  Decreased activity tolerance, Decreased endurance, Decreased range of motion, Decreased strength, Increased fascial restricitons, Impaired UE functional use, Postural dysfunction, Pain, Decreased mobility, Impaired flexibility, Impaired tone  Visit Diagnosis: Chronic left shoulder pain     Problem List Patient Active Problem List   Diagnosis Date Noted   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 [place], [address] Phone: [phone]   Fax:  [phone]  Name: Cynthia Obrien MRN: [id number] Date of Birth: Jul 09, 1966

## 2021-08-25 ENCOUNTER — Encounter: Payer: Self-pay | Admitting: Physical Therapy

## 2021-08-25 ENCOUNTER — Ambulatory Visit: Payer: BC Managed Care – PPO | Admitting: Physical Therapy

## 2021-08-25 DIAGNOSIS — M25512 Pain in left shoulder: Secondary | ICD-10-CM | POA: Diagnosis not present

## 2021-08-25 DIAGNOSIS — G8929 Other chronic pain: Secondary | ICD-10-CM

## 2021-08-25 NOTE — Therapy (Addendum)
[facility] [address], [place], [address] Phone: [phone]   Fax:  [phone]  Physical Therapy Treatment  Patient Details  Name: Cynthia Obrien MRN: [id number] Date of Birth: Mar 12, 1966 Referring Provider (PT): Gloriann Loan [place]   Encounter Date: 08/25/2021   PT End of Session - 08/25/21 0733     Visit Number 10    Number of Visits 17    Date for PT Re-Evaluation 09/12/21    Authorization - Visit Number 10    Authorization - Number of Visits 10    PT Start Time 0730    PT Stop Time 0815    PT Time Calculation (min) 45 min    Activity Tolerance Patient tolerated treatment well    Behavior During Therapy [facility] for tasks assessed/performed             Past Medical History:  Diagnosis Date   Arthritis    psoriac   COPD (chronic obstructive pulmonary disease) ([place])    Dysplastic nevus 02/13/2020   right inguinal crease, mild atypia    Fatigue    Hyperlipidemia    Hypothyroidism    Joint ache    Lower extremity edema    Psoriasis    SVT (supraventricular tachycardia) ([place])    a. 10/2011 Echo: EF 60-65%, Gr 2 DD.   Tuberculosis    patient denies this dx    Past Surgical History:  Procedure Laterality Date   ANKLE SURGERY     COLONOSCOPY     ENDOMETRIAL ABLATION     LAPAROSCOPIC GASTRIC BYPASS  01/2018   LUMBAR DISC SURGERY     SHOULDER ARTHROSCOPY Left 06/24/2021   Procedure: LEFT ARTHROSCOPY SHOULDER, DEBRIDEMENT, BICEPS TENODESIS;  Surgeon: Meredith Pel, MD;  Location: [facility];  Service: Orthopedics;  Laterality: Left;   tonsillectomy      There were no vitals filed for this visit.   Subjective Assessment - 08/25/21 0817     Subjective pnt doing well with no pain, clicking, or popping in shoulder    Pertinent History Pt is a 56 year old female presenting with L bicep tenodeis with debridement 06/24/21 Cynthia Obrien. Patient with history of chronic L shoulder pain, cleaned  out her garage and it was worse, and went to orthopedist who diagnosed her with SLAP tear, advised surgery. Following surgery patient reports much less pain and good mobility. Current pain 1/10 worst 3/10; best 0/10. Patient reports most of her pain is in her upper arm at her bicep, more so than the shoulder, comes on quick with a sharp catch when she moves her arm laterally away from her body, and (though she remarks she is not supposed to) when she reaches behind her back on accident. Post op resistrictions no lifting or ext for 6 weeks. 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 Her post op restrictions are not currently limiting her at work, but more so in her ADLs (unable to complete heavy household cleaning, modifying bathing/dressing/feeding), and her normal weight lifting regimen. She completes a weight lifting regimen with the help of a personal trainer 4days per week, and water aerobics 2x/week. Pt denies N/V, B&B changes, unexplained weight fluctuation, saddle paresthesia, fever, night sweats, or unrelenting night pain at this time.    Limitations Lifting;House hold activities;Reading    How long can you sit comfortably?  unlimited    How long can you stand comfortably? unlimited    How long can you walk comfortably? unlimited    Currently in Pain? No/denies    Pain Score 0-No pain               Ther-Ex UBE 29mins FWD 89min BWD seat 5 L5 for gentle strengthening Single arm tanding rows 5# 2x 10 with good carry over  ER BTB 3 x 12 with min cuing for posture  IR with BTB 3 x 12  Rows with cueing for eccentric control on the release 3 x 8  Standing hammer curl supinated 10# 3x 6 with min cuing for eccentric focus with good carry over Post shoulder rolls with focus on scapular retraction + depression x15 D2 flexion 3 x 6 - mod cueing for keeping shoulders down          PT Education - 08/25/21 0819      Education Details therex form/ technique    Person(s) Educated Patient    Methods Explanation;Demonstration;Tactile cues;Verbal cues    Comprehension Verbalized understanding;Returned demonstration              PT Short Term Goals - 08/25/21 0814       PT SHORT TERM GOAL #1   Title Pt will be independent with HEP in order to improve strength and decrease pain in order to improve pain-free function at home and work.    Baseline 07/16/21 HEP given. 08/25/21 independent    Status Achieved               PT Long Term Goals - 08/25/21 0734       PT LONG TERM GOAL #1   Title Patient will increase FOTO score to 72 to demonstrate predicted increase in functional mobility to complete ADLs      PT LONG TERM GOAL #2   Title Pt will decrease worst pain as reported on NPRS by at least 3 points in order to demonstrate clinically significant reduction in pain.    Baseline 07/16/21 3/10. 08/25/21 0/10    Status Achieved      PT LONG TERM GOAL #3   Title Pt will increase gross shoulder and periscapular strength to at least 4+/5 MMT grade in order to demonstrate improvement in strength and function needed for heavy household ADLs    Baseline All gross shoulder and parascapular strength 4+/5 except left parasap LT 3+/5.    Status Partially Met      PT LONG TERM GOAL #4   Title Pt will demonstrate full active shoulder ROM without scapular dyskenesis in order to demonstrate mobility needed for ADLs    Baseline 07/16/21 Flex and abd "full" with poor scapulohumeral rhythm approx 90d of true flex and abd; IR L1. 08/25/21 ROM WNL IR: T7    Status Achieved                   Plan - 08/25/21 0820     Clinical Impression Statement SPT continued therex progression to include strength in functional planes of movement. Pnt able to comply with all cueing for proper therex form with no increase in pain throughout session. Pnt demonstrated understanding of therex cueing and maintained good motivation  throughout session. SPT reviewed goals and all were met except left parascapulalar LT MMT (3+/5.) PT will progress as able.    Personal Factors and Comorbidities Comorbidity 2;Fitness;Time since onset of injury/illness/exacerbation    Comorbidities HLD, GERD    Examination-Activity  Limitations Carry;Lift;Dressing;Reach Overhead;Bathing;Sleep    Examination-Participation Restrictions Cleaning;Laundry;Community Activity;Meal Prep;Occupation    Stability/Clinical Decision Making Evolving/Moderate complexity    Clinical Decision Making Moderate    Rehab Potential Good    PT Frequency 2x / week    PT Duration 8 weeks    PT Treatment/Interventions ADLs/Self Care Home Management;Cryotherapy;Fluidtherapy;Iontophoresis 4mg /ml Dexamethasone;Functional mobility training;Therapeutic exercise;Passive range of motion;Spinal Manipulations;Joint Manipulations;Dry needling;Traction;Ultrasound;DME Instruction;Moist Heat;Electrical Stimulation;Therapeutic activities;Neuromuscular re-education;Patient/family education;Manual techniques;Taping    PT Next Visit Plan continue POC progression    PT Home Exercise Plan band ER/IR, bicep curls, rows    Consulted and Agree with Plan of Care Patient             Patient will benefit from skilled therapeutic intervention in order to improve the following deficits and impairments:  Decreased activity tolerance, Decreased endurance, Decreased range of motion, Decreased strength, Increased fascial restricitons, Impaired UE functional use, Postural dysfunction, Pain, Decreased mobility, Impaired flexibility, Impaired tone  Visit Diagnosis: Chronic left shoulder pain     Problem List Patient Active Problem List   Diagnosis Date Noted   Degenerative superior labral anterior-to-posterior (SLAP) tear of left shoulder    Morbid obesity ([place]) 03/08/2015   GERD (gastroesophageal reflux disease) 09/22/2013   Seasonal and perennial allergic rhinitis 07/16/2013   Dyspnea  12/19/2012   Cough 10/22/2012   Joint ache    Fatigue    Psoriasis    Edema 11/04/2011   Hypothyroid 11/04/2011   SVT (supraventricular tachycardia) ([place]) 10/01/2011   Hyperlipidemia 10/01/2011   Claiborne Billings O'Daniel, SPT Jerzie Bieri O'Daniel, Student-PT 08/25/2021, 8:25 AM  [facility] [address] [place], [address] Phone: [phone]   Fax:  [phone]  Name: Cynthia Obrien MRN: [id number] Date of Birth: July 30, 1965

## 2021-08-27 ENCOUNTER — Ambulatory Visit: Payer: BC Managed Care – PPO | Admitting: Physical Therapy

## 2021-08-27 ENCOUNTER — Other Ambulatory Visit: Payer: Self-pay

## 2021-08-27 ENCOUNTER — Encounter: Payer: Self-pay | Admitting: Physical Therapy

## 2021-08-27 DIAGNOSIS — G8929 Other chronic pain: Secondary | ICD-10-CM

## 2021-08-27 DIAGNOSIS — M25512 Pain in left shoulder: Secondary | ICD-10-CM | POA: Diagnosis not present

## 2021-08-27 NOTE — Therapy (Signed)
[facility] [address], [place], [address] Phone: [phone]   Fax:  [phone]  Physical Therapy D/C (Reporting Period 07/16/21- 08/27/21)  Patient Details  Name: Cynthia Obrien MRN: [id number] Date of Birth: 1965/09/23 Referring Provider (PT): Gloriann Loan [place]   Encounter Date: 08/27/2021   PT End of Session - 08/27/21 0831     Visit Number 11    Number of Visits 17    Authorization - Visit Number 11    Authorization - Number of Visits 10    PT Start Time 0730    PT Stop Time 0815    PT Time Calculation (min) 45 min    Activity Tolerance Patient tolerated treatment well    Behavior During Therapy [facility] for tasks assessed/performed             Past Medical History:  Diagnosis Date   Arthritis    psoriac   COPD (chronic obstructive pulmonary disease) ([place])    Dysplastic nevus 02/13/2020   right inguinal crease, mild atypia    Fatigue    Hyperlipidemia    Hypothyroidism    Joint ache    Lower extremity edema    Psoriasis    SVT (supraventricular tachycardia) ([place])    a. 10/2011 Echo: EF 60-65%, Gr 2 DD.   Tuberculosis    patient denies this dx    Past Surgical History:  Procedure Laterality Date   ANKLE SURGERY     COLONOSCOPY     ENDOMETRIAL ABLATION     LAPAROSCOPIC GASTRIC BYPASS  01/2018   LUMBAR DISC SURGERY     SHOULDER ARTHROSCOPY Left 06/24/2021   Procedure: LEFT ARTHROSCOPY SHOULDER, DEBRIDEMENT, BICEPS TENODESIS;  Surgeon: Meredith Pel, MD;  Location: [facility];  Service: Orthopedics;  Laterality: Left;   tonsillectomy      There were no vitals filed for this visit.   Subjective Assessment - 08/27/21 0828     Subjective pnt doing well with no pain or clicking/popping in the shoulder and feeling ready for d/c    Pertinent History Pt is a 56 year old female presenting with L bicep tenodeis with debridement 06/24/21 Cynthia Obrien. Patient with history of chronic L  shoulder pain, cleaned out her garage and it was worse, and went to orthopedist who diagnosed her with SLAP tear, advised surgery. Following surgery patient reports much less pain and good mobility. Current pain 1/10 worst 3/10; best 0/10. Patient reports most of her pain is in her upper arm at her bicep, more so than the shoulder, comes on quick with a sharp catch when she moves her arm laterally away from her body, and (though she remarks she is not supposed to) when she reaches behind her back on accident. Post op resistrictions no lifting or ext for 6 weeks. 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 Her post op restrictions are not currently limiting her at work, but more so in her ADLs (unable to complete heavy household cleaning, modifying bathing/dressing/feeding), and her normal weight lifting regimen. She completes a weight lifting regimen with the help of a personal trainer 4days per week, and water aerobics 2x/week. Pt denies N/V, B&B changes, unexplained weight fluctuation, saddle paresthesia, fever, night sweats, or unrelenting night pain at this time.    Limitations Lifting;House hold activities;Reading    How long can you sit  comfortably? unlimited    How long can you stand comfortably? unlimited    How long can you walk comfortably? unlimited    Patient Stated Goals None since surgery    Currently in Pain? No/denies    Pain Score 0-No pain               Ther-Ex UBE 8mins FWD 62min BWD seat 5 L5 for gentle strengthening Single arm standing rows 5# 3x 12 with good carry over  ER BTB 3 x 12 with min cuing for posture  Rows with cueing for eccentric control on the release 3 x 8 15lbs Standing Lat pull downs 3 x 12 15lbs- mod cueing on pulling down and back to engage LT    SPT updated HEP and how to progress reps/sets at home in order to continue to achieve endurance and strength goals              PT Education - 08/27/21 0830     Education Details therex form, HEP , d/c    Person(s) Educated Patient    Methods Explanation;Demonstration;Tactile cues;Verbal cues    Comprehension Verbalized understanding;Returned demonstration              PT Short Term Goals - 08/25/21 0814       PT SHORT TERM GOAL #1   Title Pt will be independent with HEP in order to improve strength and decrease pain in order to improve pain-free function at home and work.    Baseline 07/16/21 HEP given. 08/25/21 independent    Status Achieved               PT Long Term Goals - 08/27/21 0737       PT LONG TERM GOAL #1   Title Patient will increase FOTO score to 72 to demonstrate predicted increase in functional mobility to complete ADLs    Baseline 07/16/21 47.    Status Achieved      PT LONG TERM GOAL #2   Title Pt will decrease worst pain as reported on NPRS by at least 3 points in order to demonstrate clinically significant reduction in pain.    Baseline 07/16/21 3/10. 08/25/21 0/10    Status Achieved      PT LONG TERM GOAL #3   Title Pt will increase gross shoulder and periscapular strength to at least 4+/5 MMT grade in order to demonstrate improvement in strength and function needed for heavy household ADLs    Baseline all achieved except left parascap (4- /5 for left scap retractors)    Status Partially Met      PT LONG TERM GOAL #4   Title Pt will demonstrate full active shoulder ROM without scapular dyskenesis in order to demonstrate mobility needed for ADLs    Baseline full ROM and scapulohumeral rhythm    Status Achieved                   Plan - 08/27/21 0831     Clinical Impression Statement SPT reviewed pnt goals; all were achieved except left scapular retraction MMT. Pnt continued therex to include functional UE strength and endurance. SPT updated HEP to create a robust plan for pnt to continue strength and endurance progressions at home. Pnt  demonstrated as well as verbalized understanding of HEP instruction. Pnt maintained good motivation throughout session and consented to d/c.    Personal Factors and Comorbidities Comorbidity 2;Fitness;Time since onset of injury/illness/exacerbation    Comorbidities HLD, GERD  Examination-Activity Limitations Carry;Lift;Dressing;Reach Overhead;Bathing;Sleep    Examination-Participation Restrictions Cleaning;Laundry;Community Activity;Meal Prep;Occupation    Stability/Clinical Decision Making Evolving/Moderate complexity    Clinical Decision Making Moderate    Rehab Potential Good    PT Treatment/Interventions ADLs/Self Care Home Management;Cryotherapy;Fluidtherapy;Iontophoresis 4mg /ml Dexamethasone;Functional mobility training;Therapeutic exercise;Passive range of motion;Spinal Manipulations;Joint Manipulations;Dry needling;Traction;Ultrasound;DME Instruction;Moist Heat;Electrical Stimulation;Therapeutic activities;Neuromuscular re-education;Patient/family education;Manual techniques;Taping    PT Home Exercise Plan rows, standing lat pull downs, band ER    Consulted and Agree with Plan of Care Patient             Patient will benefit from skilled therapeutic intervention in order to improve the following deficits and impairments:  Decreased activity tolerance, Decreased endurance, Decreased range of motion, Decreased strength, Increased fascial restricitons, Impaired UE functional use, Postural dysfunction, Pain, Decreased mobility, Impaired flexibility, Impaired tone  Visit Diagnosis: Chronic left shoulder pain     Problem List Patient Active Problem List   Diagnosis Date Noted   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, [place], [address] Phone: [phone]   Fax:  [phone]  Name: Cynthia Obrien MRN: [id number] Date of Birth: 1965/12/12

## 2021-09-01 ENCOUNTER — Ambulatory Visit: Payer: BC Managed Care – PPO | Admitting: Physical Therapy

## 2021-09-03 ENCOUNTER — Ambulatory Visit: Payer: BC Managed Care – PPO | Admitting: Physical Therapy

## 2021-09-08 ENCOUNTER — Encounter: Payer: BC Managed Care – PPO | Admitting: Physical Therapy

## 2021-09-29 ENCOUNTER — Other Ambulatory Visit: Payer: Self-pay | Admitting: Dermatology

## 2021-09-29 DIAGNOSIS — L4050 Arthropathic psoriasis, unspecified: Secondary | ICD-10-CM

## 2021-10-14 ENCOUNTER — Ambulatory Visit (INDEPENDENT_AMBULATORY_CARE_PROVIDER_SITE_OTHER): Payer: BC Managed Care – PPO | Admitting: Dermatology

## 2021-10-14 DIAGNOSIS — Z79899 Other long term (current) drug therapy: Secondary | ICD-10-CM | POA: Diagnosis not present

## 2021-10-14 DIAGNOSIS — L409 Psoriasis, unspecified: Secondary | ICD-10-CM | POA: Diagnosis not present

## 2021-10-14 NOTE — Patient Instructions (Addendum)

## 2021-10-14 NOTE — Progress Notes (Signed)
? ?  Follow-Up Visit ?  ?Subjective  ?Cynthia Obrien is a 56 y.o. female who presents for the following: Follow-up (Patient here today for 6 month follow up on psoriasis. Patient reports she is staying clear on taltz injections. She does reports she has had some joint pain but thinks it was caused by recent medication she started and then discontinued. She reports she has been off medication for 2 - 3 weeks now. ).  She got a lot of joint/muscle pain after taking a new statin medication for about a month.  Since she has stopped it a couple weeks ago, it has improved, but it hasn't completely resolved.  No side effects from [place]. No infections. ? ? ?The following portions of the chart were reviewed this encounter and updated as appropriate:   ?  ? ?Review of Systems: No other skin or systemic complaints except as noted in HPI or Assessment and Plan. ? ? ?Objective  ?Well appearing patient in no apparent distress; mood and affect are within normal limits. ? ?A focused examination was performed including arms, legs, scalp, ears, elbows. Relevant physical exam findings are noted in the Assessment and Plan. ? ?arms, legs, scalp, ears, elbows ?Clear at exam ? ? ?Assessment & Plan  ?Psoriasis ?arms, legs, scalp, ears, elbows ? ?Chronic condition with duration or expected duration over one year. Currently well-controlled. ? ?With PsA , Skin well-controlled on Talz  ?Recent flare of joint pain on new statin drug.  Improving off med.  May take additional time for flare to completely resolve. ?  ?Psoriasis - severe on systemic ?biologic? treatment injections.  Psoriasis is a chronic non-curable, but treatable genetic/hereditary disease that may have other systemic features affecting other organ systems such as joints (Psoriatic Arthritis).  It is linked with heart disease, inflammatory bowel disease, non-alcoholic fatty liver disease, and depression. Significant skin psoriasis and/or psoriatic arthritis may have significant  symptoms and affects activities of daily activity and often benefits from systemic ?biologic? injection treatments.  These ?biologic? treatments have some potential side effects including immunosuppression and require pre-treatment laboratory screening and periodic laboratory monitoring and periodic in person evaluation and monitoring by the attending dermatologist physician (long term medication management).  ?  ?CBC/CMP/TB from 10/22 and 12/22  reviewed and nl.  ?Continue Taltz '80mg'$ /mL injections q 28 days for maintenance. ?Send rx to ([facility]) ?  ?Reviewed risks of biologics including immunosuppression, infections, injection site reaction, and failure to improve condition. Goal is control of skin condition, not cure.  Some older biologics such as Humira and Enbrel may slightly increase risk of malignancy and may worsen congestive heart failure. The use of biologics requires long term medication management, including periodic office visits and monitoring of blood work ? ? ?Return for 6 month psoriasis  follow up. ? ?I, Ruthell Rummage, CMA, am acting as scribe for [name], MD. ? ?Documentation: I have reviewed the above documentation for accuracy and completeness, and I agree with the above. ? ?[name] MD  ? ?

## 2021-10-20 ENCOUNTER — Other Ambulatory Visit: Payer: Self-pay | Admitting: Cardiology

## 2022-01-20 NOTE — Progress Notes (Signed)
HPI: FU SVT. Seen in the emergency room September 29, 2010 with palpitations. Her electrocardiogram revealed SVT which broke with adenosine. Patient states she had an episode similar to this previously in [place]. TSH normal. Patient has not tolerated beta blockers in past due to cough. Therefore placed on cardizem. Echocardiogram September 2016 showed normal LV systolic function. Nuclear study February 2017 showed ejection fraction 64% and normal perfusion. Since I last saw her, the patient denies any dyspnea on exertion, orthopnea, PND, pedal edema, palpitations, syncope or chest pain.   Current Outpatient Medications  Medication Sig Dispense Refill   Azelaic Acid (FINACEA) 15 % FOAM Apply to face 1-2 times a day for rosacea. (Patient taking differently: Apply 1 application  topically 2 (two) times daily as needed (rosacea.).) 50 g 5   CALCIUM PO Take 1,500 Units by mouth daily.     diltiazem (CARDIZEM CD) 120 MG 24 hr capsule Take 1 capsule by mouth once daily 90 capsule 1   furosemide (LASIX) 20 MG tablet Take 20 mg by mouth.     levothyroxine (SYNTHROID) 125 MCG tablet Take 125 mcg by mouth daily before breakfast.     Multiple Vitamins-Minerals (BARIATRIC MULTIVITAMINS/IRON PO) Take 1 tablet by mouth daily.     Potassium 99 MG TABS Take 99 mg by mouth daily.     sertraline (ZOLOFT) 50 MG tablet Take 50 mg by mouth daily.     TALTZ 80 MG/ML SOAJ INJECT 80 MG UNDER THE SKIN EVERY 28 DAYS FOR MAINTENANCE 1 mL 5   No current facility-administered medications for this visit.     Past Medical History:  Diagnosis Date   Arthritis    psoriac   COPD (chronic obstructive pulmonary disease) ([place])    Dysplastic nevus 02/13/2020   right inguinal crease, mild atypia    Fatigue    Hyperlipidemia    Hypothyroidism    Joint ache    Lower extremity edema    Psoriasis    SVT (supraventricular tachycardia) ([place])    a. 10/2011 Echo: EF 60-65%, Gr 2 DD.   Tuberculosis    patient denies this  dx    Past Surgical History:  Procedure Laterality Date   ANKLE SURGERY     COLONOSCOPY     ENDOMETRIAL ABLATION     LAPAROSCOPIC GASTRIC BYPASS  01/2018   LUMBAR DISC SURGERY     SHOULDER ARTHROSCOPY Left 06/24/2021   Procedure: LEFT ARTHROSCOPY SHOULDER, DEBRIDEMENT, BICEPS TENODESIS;  Surgeon: Meredith Pel, MD;  Location: [facility];  Service: Orthopedics;  Laterality: Left;   tonsillectomy      Social History   Socioeconomic History   Marital status: Single    Spouse name: Not on file   Number of children: 0   Years of education: Not on file   Highest education level: Not on file  Occupational History    Comment: Corporate investment banker  Tobacco Use   Smoking status: Former    Packs/day: 1.00    Years: 16.00    Total pack years: 16.00    Types: Cigarettes    Quit date: 07/13/1997    Years since quitting: 24.5   Smokeless tobacco: Never  Vaping Use   Vaping Use: Never used  Substance and Sexual Activity   Alcohol use: Yes    Alcohol/week: 7.0 standard drinks of alcohol    Types: 7 Glasses of wine per week    Comment: 1 glass of wine daily   Drug use: Not Currently  Sexual activity: Not on file    Comment: ablation  Other Topics Concern   Not on file  Social History Narrative   Not on file   Social Determinants of Health   Financial Resource Strain: Not on file  Food Insecurity: Not on file  Transportation Needs: Not on file  Physical Activity: Not on file  Stress: Not on file  Social Connections: Not on file  Intimate Partner Violence: Not on file    Family History  Problem Relation Age of Onset   Heart disease Mother        MV repair; ICD   Hypertension Mother    Kidney disease Mother    Skin cancer Maternal Grandmother    Stroke Father    Diabetes Father    Hypertension Father    Diabetes Sister     ROS: no fevers or chills, productive cough, hemoptysis, dysphasia, odynophagia, melena, hematochezia, dysuria, hematuria, rash, seizure activity,  orthopnea, PND, pedal edema, claudication. Remaining systems are negative.  Physical Exam: Well-developed well-nourished in no acute distress.  Skin is warm and dry.  HEENT is normal.  Neck is supple.  Chest is clear to auscultation with normal expansion.  Cardiovascular exam is regular rate and rhythm.  Abdominal exam nontender or distended. No masses palpated. Extremities show no edema. neuro grossly intact  ECG-sinus bradycardia at a rate of 56, no ST changes.  Personally reviewed  A/P  1 supraventricular tachycardia-no recurrences.  Note she has not tolerated beta-blockers in the past.  We will therefore continue present dose of Cardizem.  Can consider referral for ablation in the future if she has more frequent episodes.  2 hyperlipidemia-managed by primary care.  3 lower extremity edema-controlled.  Continue Lasix as needed.  Kirk Ruths, MD

## 2022-02-03 ENCOUNTER — Ambulatory Visit (INDEPENDENT_AMBULATORY_CARE_PROVIDER_SITE_OTHER): Payer: BC Managed Care – PPO | Admitting: Cardiology

## 2022-02-03 ENCOUNTER — Encounter: Payer: Self-pay | Admitting: Cardiology

## 2022-02-03 VITALS — BP 138/80 | HR 56 | Ht 65.0 in | Wt 273.8 lb

## 2022-02-03 DIAGNOSIS — E7800 Pure hypercholesterolemia, unspecified: Secondary | ICD-10-CM | POA: Diagnosis not present

## 2022-02-03 DIAGNOSIS — R600 Localized edema: Secondary | ICD-10-CM

## 2022-02-03 DIAGNOSIS — I471 Supraventricular tachycardia: Secondary | ICD-10-CM

## 2022-02-03 NOTE — Patient Instructions (Signed)

## 2022-02-23 ENCOUNTER — Other Ambulatory Visit: Payer: Self-pay | Admitting: Cardiology

## 2022-03-13 ENCOUNTER — Ambulatory Visit (INDEPENDENT_AMBULATORY_CARE_PROVIDER_SITE_OTHER): Payer: BC Managed Care – PPO

## 2022-03-13 ENCOUNTER — Ambulatory Visit: Payer: Self-pay

## 2022-03-13 ENCOUNTER — Ambulatory Visit (INDEPENDENT_AMBULATORY_CARE_PROVIDER_SITE_OTHER): Payer: BC Managed Care – PPO | Admitting: Surgical

## 2022-03-13 DIAGNOSIS — Z9889 Other specified postprocedural states: Secondary | ICD-10-CM

## 2022-03-13 DIAGNOSIS — M19012 Primary osteoarthritis, left shoulder: Secondary | ICD-10-CM

## 2022-03-15 ENCOUNTER — Encounter: Payer: Self-pay | Admitting: Surgical

## 2022-03-15 MED ORDER — LIDOCAINE HCL 1 % IJ SOLN
5.0000 mL | INTRAMUSCULAR | Status: AC | PRN
  Administered 2022-03-13: 5 mL

## 2022-03-15 MED ORDER — METHYLPREDNISOLONE ACETATE 40 MG/ML IJ SUSP
40.0000 mg | INTRAMUSCULAR | Status: AC | PRN
  Administered 2022-03-13: 40 mg via INTRA_ARTICULAR

## 2022-03-15 MED ORDER — BUPIVACAINE HCL 0.5 % IJ SOLN
9.0000 mL | INTRAMUSCULAR | Status: AC | PRN
  Administered 2022-03-13: 9 mL via INTRA_ARTICULAR

## 2022-03-15 NOTE — Progress Notes (Signed)
Office Visit Note   Patient: Cynthia Obrien           Date of Birth: [birth year]           MRN: [id number] Visit Date: 03/13/2022 Requested by: Jilda Panda, MD [address] [place],  [place] [address] PCP: Jilda Panda, MD  Subjective: Chief Complaint  Patient presents with   Left Shoulder - Pain    HPI: Cynthia Obrien is a 56 y.o. female who presents to the office complaining of left shoulder pain.  Patient states that in July, she was getting out of a truck and holding onto the truck as she stepped down with her left arm.  As she stepped down her shoulder flexed more than she was expecting and she noticed a twinge of pain.  She has had persistent superior and anterior left shoulder pain since this event.  She has history of prior left shoulder arthroscopy with mini open biceps tenodesis in December 2022 with arthroscopy that noted significant chondromalacia of the humeral head and glenoid at that time.  She has been taking aspirin for pain and as long as she takes aspirin she can go to sleep at night but without this medication she wakes up with pain pretty much every night.  She has occasional trapezius pain but this is improving.  No numbness or tingling.  No radicular pain down the arm.  She has no difficulty lifting her arm overhead.  She does have some increased mechanical clicking since the event but she did have some clicking in the shoulder prior to her July injury.  Pain has not significantly improved in the last month in regards of her shoulder pain..                ROS: All systems reviewed are negative as they relate to the chief complaint within the history of present illness.  Patient denies fevers or chills.  Assessment & Plan: Visit Diagnoses:  1. S/P arthroscopy of left shoulder   2. Primary osteoarthritis, left shoulder     Plan: Patient is a 56 year old female who presents for evaluation of left shoulder pain.  She had an injury in July when her shoulder forward  flex more than she was expecting it to.  She has had persistent pain since then that wakes her up at night.  She does have some increased clicking sensation that she has noticed and this is palpable on exam today.  She has a history of significant chondromalacia and arthritis of the left shoulder glenohumeral joint that is noted on radiographs today.  Radiographs do not demonstrate any fracture or dislocation.  She has bicep tendon that seems to be intact at the biceps tenodesis site based on exam today.  Main differential includes aggravation of her glenohumeral arthritis versus new supraspinatus tendon tear based on the slight weakness and reproduction of symptoms that she gets from stressing this tendon on exam today.  After discussion of options, she would like to try ultrasound-guided glenohumeral injection of the left shoulder.  This was successfully delivered under ultrasound guidance today in the clinic.  She tolerated procedure well.  She will give this about 2 weeks to take effect and if no improvement at that time, she will call the office and we will order MRI arthrogram of the left shoulder for further evaluation of rotator cuff tear.  Follow-Up Instructions: No follow-ups on file.   Orders:  Orders Placed This Encounter  Procedures   XR Shoulder Left  US Guided Needle Placement - No Linked Charges   No orders of the defined types were placed in this encounter.     Procedures: Large Joint Inj: L glenohumeral on 03/13/2022 2:10 PM Indications: diagnostic evaluation and pain Details: 22 G 3.5 in needle, ultrasound-guided posterior approach  Arthrogram: No  Medications: 9 mL bupivacaine 0.5 %; 40 mg methylPREDNISolone acetate 40 MG/ML; 5 mL lidocaine 1 % Outcome: tolerated well, no immediate complications Procedure, treatment alternatives, risks and benefits explained, specific risks discussed. Consent was given by the patient. Immediately prior to procedure a time out was called to  verify the correct patient, procedure, equipment, support staff and site/side marked as required. Patient was prepped and draped in the usual sterile fashion.       Clinical Data: No additional findings.  Objective: Vital Signs: There were no vitals taken for this visit.  Physical Exam:  Constitutional: Patient appears well-developed HEENT:  Head: Normocephalic Eyes:EOM are normal Neck: Normal range of motion Cardiovascular: Normal rate Pulmonary/chest: Effort normal Neurologic: Patient is alert Skin: Skin is warm Psychiatric: Patient has normal mood and affect  Ortho Exam: Ortho exam demonstrates left shoulder with 60 degrees external rotation, 80 degrees abduction, 160 degrees forward flexion.  She has well-healed incisions from prior left shoulder arthroscopy and mini open biceps tenodesis.  She has no tenderness over the [facility] joint.  Mild tenderness over the bicipital groove but the bicep tendon is palpable and intact.  There is no Popeye deformity noted with bicep contour fairly equivalent to the contralateral arm.  She has active forward flexion and abduction is equivalent to her passive motion.  Excellent rotator cuff strength of infraspinatus and subscapularis rated 5/5.  She does have a slight amount of weakness with supraspinatus rated 5 -/5 with some reproduction of her pain.  No pain with cervical spine range of motion.  Negative Lhermitte sign.  Negative Spurling sign.  5/5 motor strength of bilateral grip strength, finger abduction, pronation/supination, bicep, tricep, deltoid.  There is no increased pain with resisted supination or bicep flexion.  Specialty Comments:  No specialty comments available.  Imaging: No results found.   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    Morbid obesity ([place]) 03/08/2015   GERD (gastroesophageal reflux disease) 09/22/2013    Seasonal and perennial allergic rhinitis 07/16/2013   Dyspnea 12/19/2012   Cough 10/22/2012   Joint ache    Fatigue    Psoriasis    Edema 11/04/2011   Hypothyroid 11/04/2011   SVT (supraventricular tachycardia) ([place]) 10/01/2011   Hyperlipidemia 10/01/2011   Past Medical History:  Diagnosis Date   Arthritis    psoriac   COPD (chronic obstructive pulmonary disease) ([place])    Dysplastic nevus 02/13/2020   right inguinal crease, mild atypia    Fatigue    Hyperlipidemia    Hypothyroidism    Joint ache    Lower extremity edema    Psoriasis    SVT (supraventricular tachycardia) ([place])    a. 10/2011 Echo: EF 60-65%, Gr 2 DD.   Tuberculosis    patient denies this dx    Family History  Problem Relation Age of Onset   Heart disease Mother        MV repair; ICD   Hypertension Mother    Kidney disease Mother    Skin cancer Maternal Grandmother    Stroke Father    Diabetes Father  Hypertension Father    Diabetes Sister     Past Surgical History:  Procedure Laterality Date   ANKLE SURGERY     COLONOSCOPY     ENDOMETRIAL ABLATION     LAPAROSCOPIC GASTRIC BYPASS  01/2018   LUMBAR DISC SURGERY     SHOULDER ARTHROSCOPY Left 06/24/2021   Procedure: LEFT ARTHROSCOPY SHOULDER, DEBRIDEMENT, BICEPS TENODESIS;  Surgeon: Meredith Pel, MD;  Location: [facility];  Service: Orthopedics;  Laterality: Left;   tonsillectomy     Social History   Occupational History    Comment: Corporate investment banker  Tobacco Use   Smoking status: Former    Packs/day: 1.00    Years: 16.00    Total pack years: 16.00    Types: Cigarettes    Quit date: 07/13/1997    Years since quitting: 24.6   Smokeless tobacco: Never  Vaping Use   Vaping Use: Never used  Substance and Sexual Activity   Alcohol use: Yes    Alcohol/week: 7.0 standard drinks of alcohol    Types: 7 Glasses of wine per week    Comment: 1 glass of wine daily   Drug use: Not Currently   Sexual activity: Not on file    Comment: ablation

## 2022-03-23 ENCOUNTER — Ambulatory Visit: Payer: BC Managed Care – PPO | Admitting: Orthopedic Surgery

## 2022-03-25 ENCOUNTER — Other Ambulatory Visit: Payer: Self-pay

## 2022-03-25 DIAGNOSIS — G8929 Other chronic pain: Secondary | ICD-10-CM

## 2022-03-25 DIAGNOSIS — Z9889 Other specified postprocedural states: Secondary | ICD-10-CM

## 2022-03-25 DIAGNOSIS — M19012 Primary osteoarthritis, left shoulder: Secondary | ICD-10-CM

## 2022-04-10 ENCOUNTER — Telehealth: Payer: Self-pay

## 2022-04-10 NOTE — Telephone Encounter (Signed)
Phone call to patient to review instructions for 13 hr prep for MRI Arthrogram w/ contrast on 04/13/22 @ 3:30 PM. Prescription called into [facility]. Pt aware and verbalized understanding of instructions. Prescription: 2:30 AM- '50mg'$  Prednisone 8:30 AM- '50mg'$  Prednisone 2:30 PM - '50mg'$  Prednisone and '50mg'$  Benadryl   Benadryl was not called in as a separate prescription as the patient reported she has benadryl at home and verbalized understanding of when to take it. Advised the patient not to drive the day of taking this medication as it may cause drowsiness.

## 2022-04-11 ENCOUNTER — Other Ambulatory Visit: Payer: Self-pay | Admitting: Cardiology

## 2022-04-13 ENCOUNTER — Ambulatory Visit
Admission: RE | Admit: 2022-04-13 | Discharge: 2022-04-13 | Disposition: A | Discharge status: Home / Self Care | Payer: BC Managed Care – PPO | Source: Ambulatory Visit | Attending: Orthopedic Surgery | Admitting: Orthopedic Surgery

## 2022-04-13 DIAGNOSIS — G8929 Other chronic pain: Secondary | ICD-10-CM

## 2022-04-13 DIAGNOSIS — Z9889 Other specified postprocedural states: Secondary | ICD-10-CM

## 2022-04-13 DIAGNOSIS — M19012 Primary osteoarthritis, left shoulder: Secondary | ICD-10-CM

## 2022-04-13 MED ORDER — IOPAMIDOL (ISOVUE-M 200) INJECTION 41%
12.0000 mL | Freq: Once | INTRAMUSCULAR | Status: AC
  Administered 2022-04-13: 12 mL via INTRA_ARTICULAR

## 2022-04-20 ENCOUNTER — Other Ambulatory Visit: Payer: Self-pay

## 2022-04-20 DIAGNOSIS — L4050 Arthropathic psoriasis, unspecified: Secondary | ICD-10-CM

## 2022-04-20 MED ORDER — TALTZ 80 MG/ML ~~LOC~~ SOAJ: SUBCUTANEOUS | 0 refills | Status: DC

## 2022-04-20 NOTE — Progress Notes (Signed)
Refill request faxed from [facility]

## 2022-04-24 ENCOUNTER — Ambulatory Visit (INDEPENDENT_AMBULATORY_CARE_PROVIDER_SITE_OTHER): Payer: BC Managed Care – PPO | Admitting: Surgical

## 2022-04-24 DIAGNOSIS — Z9889 Other specified postprocedural states: Secondary | ICD-10-CM | POA: Diagnosis not present

## 2022-04-24 DIAGNOSIS — M19012 Primary osteoarthritis, left shoulder: Secondary | ICD-10-CM | POA: Diagnosis not present

## 2022-04-26 ENCOUNTER — Encounter: Payer: Self-pay | Admitting: Surgical

## 2022-04-26 NOTE — Progress Notes (Cosign Needed Addendum)
Follow-up Office Visit Note   Patient: Cynthia Obrien           Date of Birth: [birth year]           MRN: [id number] Visit Date: 04/24/2022 Requested by: Jilda Panda, MD [id number] [place],  [place] [address] PCP: Jilda Panda, MD  Subjective: Chief Complaint  Patient presents with   Left Shoulder - Pain    HPI: Cynthia Obrien is a 56 y.o. female who returns to the office for follow-up visit.    Plan at last visit was: Patient is a 55 year old female who presents for evaluation of left shoulder pain.  She had an injury in July when her shoulder forward flex more than she was expecting it to.  She has had persistent pain since then that wakes her up at night.  She does have some increased clicking sensation that she has noticed and this is palpable on exam today.  She has a history of significant chondromalacia and arthritis of the left shoulder glenohumeral joint that is noted on radiographs today.  Radiographs do not demonstrate any fracture or dislocation.  She has bicep tendon that seems to be intact at the biceps tenodesis site based on exam today.  Main differential includes aggravation of her glenohumeral arthritis versus new supraspinatus tendon tear based on the slight weakness and reproduction of symptoms that she gets from stressing this tendon on exam today.  After discussion of options, she would like to try ultrasound-guided glenohumeral injection of the left shoulder.  This was successfully delivered under ultrasound guidance today in the clinic.  She tolerated procedure well.  She will give this about 2 weeks to take effect and if no improvement at that time, she will call the office and we will order MRI arthrogram of the left shoulder for further evaluation of rotator cuff tear.  Since then, patient notes she had MRI of the left shoulder.  She got 50% improvement from the injection but it began to wear off after several weeks.  She does note that overall she is better today  than she was at the time of her last appointment and she is overall trending better compared with the initial time of injury.              ROS: All systems reviewed are negative as they relate to the chief complaint within the history of present illness.  Patient denies fevers or chills.  Assessment & Plan: Visit Diagnoses:  1. Primary osteoarthritis, left shoulder   2. S/P arthroscopy of left shoulder     Plan: Cynthia Obrien is a 56 y.o. female who returns to the office for follow-up visit for left shoulder pain.  Plan from last visit was noted above in HPI.  They now return with improved pain but left shoulder pain that has continued since her injury.  Overall trending better and is no longer severe.  MRI was reviewed with her today and it demonstrates pretty much exactly the same appearance of the supraspinatus tear from her last MRI scan with no change.  Continued glenohumeral arthritis present.  There is a little bit of new partial-thickness tearing of the infraspinatus but this is not really correlate with the location of her symptoms or with her exam; infraspinatus strength testing does not really reproduce the pain she is experiencing.  Bicep tendon does appear intact on MRI scan without retraction.  Difficult to visualize any of the tendon on ultrasound exam today though  exam is somewhat limited due to patient's body habitus.  Most of her symptoms seem to correlate with the area of the bicep tendon.  However, with her symptoms slowly improving, no intervention indicated at this time.  Could consider repeat glenohumeral injection or bicep tendon sheath injection in the future if her symptoms persist.  Plan to follow-up with Dr. Marlou Sa in 6 weeks for clinical recheck.  Follow-Up Instructions: No follow-ups on file.   Orders:  No orders of the defined types were placed in this encounter.  No orders of the defined types were placed in this encounter.     Procedures: No procedures  performed   Clinical Data: No additional findings.  Objective: Vital Signs: There were no vitals taken for this visit.  Physical Exam:  Constitutional: Patient appears well-developed HEENT:  Head: Normocephalic Eyes:EOM are normal Neck: Normal range of motion Cardiovascular: Normal rate Pulmonary/chest: Effort normal Neurologic: Patient is alert Skin: Skin is warm Psychiatric: Patient has normal mood and affect  Ortho Exam: Ortho exam demonstrates left shoulder with intact active and passive range of motion comparable to the last exam.  She has mild to moderate tenderness overlying the bicipital groove.  No tenderness over the [facility] joint.  Excellent rotator cuff strength of supra, infra, subscap.  There is a very slight difference in contour of the left bicep compared with the right bicep on today's exam.  No obvious Popeye deformity.  No weakness with supination or bicep flexion but she does have reproduction of pain with this.  Positive O'Brien sign.  Specialty Comments:  No specialty comments available.  Imaging: No results found.   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    Morbid obesity ([place]) 03/08/2015   GERD (gastroesophageal reflux disease) 09/22/2013   Seasonal and perennial allergic rhinitis 07/16/2013   Dyspnea 12/19/2012   Cough 10/22/2012   Joint ache    Fatigue    Psoriasis    Edema 11/04/2011   Hypothyroid 11/04/2011   SVT (supraventricular tachycardia) 10/01/2011   Hyperlipidemia 10/01/2011   Past Medical History:  Diagnosis Date   Arthritis    psoriac   COPD (chronic obstructive pulmonary disease) ([place])    Dysplastic nevus 02/13/2020   right inguinal crease, mild atypia    Fatigue    Hyperlipidemia    Hypothyroidism    Joint ache    Lower extremity edema    Psoriasis    SVT (supraventricular tachycardia) ([place])    a. 10/2011 Echo: EF  60-65%, Gr 2 DD.   Tuberculosis    patient denies this dx    Family History  Problem Relation Age of Onset   Heart disease Mother        MV repair; ICD   Hypertension Mother    Kidney disease Mother    Skin cancer Maternal Grandmother    Stroke Father    Diabetes Father    Hypertension Father    Diabetes Sister     Past Surgical History:  Procedure Laterality Date   ANKLE SURGERY     COLONOSCOPY     ENDOMETRIAL ABLATION     LAPAROSCOPIC GASTRIC BYPASS  01/2018   LUMBAR [place] SURGERY     SHOULDER ARTHROSCOPY Left 06/24/2021   Procedure: LEFT ARTHROSCOPY SHOULDER, DEBRIDEMENT, BICEPS TENODESIS;  Surgeon: Meredith Pel, MD;  Location: [facility];  Service: Orthopedics;  Laterality: Left;   tonsillectomy     Social  History   Occupational History    Comment: Corporate investment banker  Tobacco Use   Smoking status: Former    Packs/day: 1.00    Years: 16.00    Total pack years: 16.00    Types: Cigarettes    Quit date: 07/13/1997    Years since quitting: 24.8   Smokeless tobacco: Never  Vaping Use   Vaping Use: Never used  Substance and Sexual Activity   Alcohol use: Yes    Alcohol/week: 7.0 standard drinks of alcohol    Types: 7 Glasses of wine per week    Comment: 1 glass of wine daily   Drug use: Not Currently   Sexual activity: Not on file    Comment: ablation

## 2022-05-04 ENCOUNTER — Ambulatory Visit (INDEPENDENT_AMBULATORY_CARE_PROVIDER_SITE_OTHER): Payer: BC Managed Care – PPO | Admitting: Dermatology

## 2022-05-04 DIAGNOSIS — Z79899 Other long term (current) drug therapy: Secondary | ICD-10-CM

## 2022-05-04 DIAGNOSIS — L409 Psoriasis, unspecified: Secondary | ICD-10-CM

## 2022-05-04 DIAGNOSIS — L4050 Arthropathic psoriasis, unspecified: Secondary | ICD-10-CM

## 2022-05-04 NOTE — Patient Instructions (Addendum)
Reviewed risks of biologics including immunosuppression, infections, injection site reaction, and failure to improve condition. Goal is control of skin condition, not cure.  Some older biologics such as Humira and Enbrel may slightly increase risk of malignancy and may worsen congestive heart failure.  Talz and Cosentyx may cause inflammatory bowel disease to flare. The use of biologics requires long term medication management, including periodic office visits and monitoring of blood work.   Due to recent changes in healthcare laws, you may see results of your pathology and/or laboratory studies on MyChart before the doctors have had a chance to review them. We understand that in some cases there may be results that are confusing or concerning to you. Please understand that not all results are received at the same time and often the doctors may need to interpret multiple results in order to provide you with the best plan of care or course of treatment. Therefore, we ask that you please give us 2 business days to thoroughly review all your results before contacting the office for clarification. Should we see a critical lab result, you will be contacted sooner.   If You Need Anything After Your Visit  If you have any questions or concerns for your doctor, please call our main line at [phone] and press option 4 to reach your doctor's medical assistant. If no one answers, please leave a voicemail as directed and we will return your call as soon as possible. Messages left after 4 pm will be answered the following business day.   You may also send us a message via MyChart. We typically respond to MyChart messages within 1-2 business days.  For prescription refills, please ask your pharmacy to contact our office. Our fax number is [phone].  If you have an urgent issue when the clinic is closed that cannot wait until the next business day, you can page your doctor at the number below.    Please  note that while we do our best to be available for urgent issues outside of office hours, we are not available 24/7.   If you have an urgent issue and are unable to reach us, you may choose to seek medical care at your doctor's office, retail clinic, urgent care center, or emergency room.  If you have a medical emergency, please immediately call 911 or go to the emergency department.  Pager Numbers  - Dr. Kowalski: [phone]  - Dr. Moye: [phone]  - Dr. Stewart: [phone]  In the event of inclement weather, please call our main line at [phone] for an update on the status of any delays or closures.  Dermatology Medication Tips: Please keep the boxes that topical medications come in in order to help keep track of the instructions about where and how to use these. Pharmacies typically print the medication instructions only on the boxes and not directly on the medication tubes.   If your medication is too expensive, please contact our office at [phone] option 4 or send us a message through MyChart.   We are unable to tell what your co-pay for medications will be in advance as this is different depending on your insurance coverage. However, we may be able to find a substitute medication at lower cost or fill out paperwork to get insurance to cover a needed medication.   If a prior authorization is required to get your medication covered by your insurance company, please allow us 1-2 business days to complete this process.  Drug prices often vary   depending on where the prescription is filled and some pharmacies may offer cheaper prices.  The website www.goodrx.com contains coupons for medications through different pharmacies. The prices here do not account for what the cost may be with help from insurance (it may be cheaper with your insurance), but the website can give you the price if you did not use any insurance.  - You can print the associated coupon and take it with  your prescription to the pharmacy.  - You may also stop by our office during regular business hours and pick up a GoodRx coupon card.  - If you need your prescription sent electronically to a different pharmacy, notify our office through [facility] MyChart or by phone at [phone] option 4.     Si Usted Necesita Algo Despus de Su Visita  Tambin puede enviarnos un mensaje a travs de MyChart. Por lo general respondemos a los mensajes de MyChart en el transcurso de 1 a 2 das hbiles.  Para renovar recetas, por favor pida a su farmacia que se ponga en contacto con nuestra oficina. Nuestro nmero de fax es el [phone].  Si tiene un asunto urgente cuando la clnica est cerrada y que no puede esperar hasta el siguiente da hbil, puede llamar/localizar a su doctor(a) al nmero que aparece a continuacin.   Por favor, tenga en cuenta que aunque hacemos todo lo posible para estar disponibles para asuntos urgentes fuera del horario de oficina, no estamos disponibles las 24 horas del da, los 7 das de la semana.   Si tiene un problema urgente y no puede comunicarse con nosotros, puede optar por buscar atencin mdica  en el consultorio de su doctor(a), en una clnica privada, en un centro de atencin urgente o en una sala de emergencias.  Si tiene una emergencia mdica, por favor llame inmediatamente al 911 o vaya a la sala de emergencias.  Nmeros de bper  - Dr. Kowalski: [phone]  - Dra. Moye: [phone]  - Dra. Stewart: [phone]  En caso de inclemencias del tiempo, por favor llame a nuestra lnea principal al [phone] para una actualizacin sobre el estado de cualquier retraso o cierre.  Consejos para la medicacin en dermatologa: Por favor, guarde las cajas en las que vienen los medicamentos de uso tpico para ayudarle a seguir las instrucciones sobre dnde y cmo usarlos. Las farmacias generalmente imprimen las instrucciones del medicamento slo en las cajas y  no directamente en los tubos del medicamento.   Si su medicamento es muy caro, por favor, pngase en contacto con nuestra oficina llamando al [phone] y presione la opcin 4 o envenos un mensaje a travs de MyChart.   No podemos decirle cul ser su copago por los medicamentos por adelantado ya que esto es diferente dependiendo de la cobertura de su seguro. Sin embargo, es posible que podamos encontrar un medicamento sustituto a menor costo o llenar un formulario para que el seguro cubra el medicamento que se considera necesario.   Si se requiere una autorizacin previa para que su compaa de seguros cubra su medicamento, por favor permtanos de 1 a 2 das hbiles para completar este proceso.  Los precios de los medicamentos varan con frecuencia dependiendo del lugar de dnde se surte la receta y alguna farmacias pueden ofrecer precios ms baratos.  El sitio web www.goodrx.com tiene cupones para medicamentos de diferentes farmacias. Los precios aqu no tienen en cuenta lo que podra costar con la ayuda del seguro (puede ser ms barato con su seguro),   pero el sitio web puede darle el precio si no utiliz ningn seguro.  - Puede imprimir el cupn correspondiente y llevarlo con su receta a la farmacia.  - Tambin puede pasar por nuestra oficina durante el horario de atencin regular y recoger una tarjeta de cupones de GoodRx.  - Si necesita que su receta se enve electrnicamente a una farmacia diferente, informe a nuestra oficina a travs de MyChart de [facility] o por telfono llamando al [phone] y presione la opcin 4.  

## 2022-05-04 NOTE — Progress Notes (Signed)
   Follow-Up Visit   Subjective  Cynthia Obrien is a 56 y.o. female who presents for the following: Follow-up.  Patient presents for 6 month follow-up Psoriasis with PsA. She has worsened in the past 2 months with psoriasis on her bilateral great toes and toenail. She has also had increased aching and joint pain knees, ankles, elbows, shoulders. She is very stiff in the mornings when she wakes up. She took her last Taltz injection last Friday, and was in a lot of pain Sat/Sun. Patient has tried and failed [place], Humira, Enbrel, Cosentyx, Skyrizi, and MTX (had to d/c due to kidney function decline).  She has seen rheumatologist in the past, but not recently.  Skin doing well on Talz.  The following portions of the chart were reviewed this encounter and updated as appropriate:       Review of Systems:  No other skin or systemic complaints except as noted in HPI or Assessment and Plan.  Objective  Well appearing patient in no apparent distress; mood and affect are within normal limits.  A focused examination was performed including face, right foot, left leg, toenails. Relevant physical exam findings are noted in the Assessment and Plan.  toenails, thigh Mild distal thickening on the BL great toenail, polish on toenails; pink scaly papule on left thigh.    Assessment & Plan  Psoriasis toenails, thigh  With PsA. Chronic and persistent condition with duration or expected duration over one year. Condition is symptomatic / bothersome to patient. Skin well-controlled but joints not to goal.  Psoriasis - severe on systemic "biologic" treatment injections.  Psoriasis is a chronic non-curable, but treatable genetic/hereditary disease that may have other systemic features affecting other organ systems such as joints (Psoriatic Arthritis).  It is linked with heart disease, inflammatory bowel disease, non-alcoholic fatty liver disease, and depression. Significant skin psoriasis and/or psoriatic  arthritis may have significant symptoms and affects activities of daily activity and often benefits from systemic "biologic" injection treatments.  These "biologic" treatments have some potential side effects including immunosuppression and require pre-treatment laboratory screening and periodic laboratory monitoring and periodic in person evaluation and monitoring by the attending dermatologist physician (long term medication management).  Continue Taltz 80 MG/ML inject under the skin q 28 days until Cimzia approved, then will switch.  CBC/CMP Labs reviewed from Dr Mellody Drown, wnl. Quantiferon Gold ordered today. Pending labs, will send Cimzia prescription to [facility] for insurance approval. Will plan on 3-6 month course.  If joint pain not improving, she should return to her rheumatologist for further options.  Reviewed risks of biologics including immunosuppression, infections, injection site reaction, and failure to improve condition. Goal is control of skin condition, not cure.  Some older biologics such as Humira and Enbrel may slightly increase risk of malignancy and may worsen congestive heart failure.  Talz and Cosentyx may cause inflammatory bowel disease to flare. The use of biologics requires long term medication management, including periodic office visits and monitoring of blood work.   QuantiFERON-TB Gold Plus - toenails, thigh   Return in about 2 months (around 07/04/2022) for Psoriasis.  IJamesetta Orleans, CMA, am acting as scribe for [name], MD .  Documentation: I have reviewed the above documentation for accuracy and completeness, and I agree with the above.  [name] MD

## 2022-05-11 ENCOUNTER — Other Ambulatory Visit: Payer: Self-pay | Admitting: Dermatology

## 2022-05-11 DIAGNOSIS — L4050 Arthropathic psoriasis, unspecified: Secondary | ICD-10-CM

## 2022-05-12 ENCOUNTER — Other Ambulatory Visit: Payer: Self-pay | Admitting: Dermatology

## 2022-05-12 DIAGNOSIS — L719 Rosacea, unspecified: Secondary | ICD-10-CM

## 2022-05-17 LAB — QUANTIFERON-TB GOLD PLUS
QuantiFERON Mitogen Value: 4.23 IU/mL
QuantiFERON Nil Value: 0 IU/mL
QuantiFERON TB1 Ag Value: 0.1 IU/mL
QuantiFERON TB2 Ag Value: 0.07 IU/mL
QuantiFERON-TB Gold Plus: NEGATIVE

## 2022-05-18 ENCOUNTER — Encounter: Payer: Self-pay | Admitting: *Deleted

## 2022-05-18 ENCOUNTER — Telehealth: Payer: Self-pay

## 2022-05-18 MED ORDER — CIMZIA 2 X 200 MG ~~LOC~~ KIT: 400.0000 mg | PACK | SUBCUTANEOUS | 6 refills | Status: DC

## 2022-05-18 NOTE — Telephone Encounter (Signed)
Left pt msg to call for lab results.  Cimzia sent into [facility]

## 2022-05-18 NOTE — Telephone Encounter (Signed)
-----   Message from [name], MD sent at 05/18/2022 12:41 PM EST ----- TB negative, send in Cimzia as per note - please call patient

## 2022-05-19 ENCOUNTER — Telehealth: Payer: Self-pay

## 2022-05-19 NOTE — Telephone Encounter (Signed)
-----   Message from [name], MD sent at 05/18/2022 12:41 PM EST ----- TB negative, send in Cimzia as per note - please call patient

## 2022-05-19 NOTE — Telephone Encounter (Signed)
Patient advised of lab results and that Cimzia process would be started. aw

## 2022-05-22 ENCOUNTER — Ambulatory Visit (INDEPENDENT_AMBULATORY_CARE_PROVIDER_SITE_OTHER): Payer: BC Managed Care – PPO | Admitting: Orthopedic Surgery

## 2022-05-22 ENCOUNTER — Encounter: Payer: Self-pay | Admitting: Orthopedic Surgery

## 2022-05-22 DIAGNOSIS — M19012 Primary osteoarthritis, left shoulder: Secondary | ICD-10-CM

## 2022-05-22 NOTE — Progress Notes (Addendum)
Office Visit Note   Patient: Cynthia Obrien           Date of Birth: 30-Jul-1965           MRN: [id number] Visit Date: 05/22/2022 Requested by: Jilda Panda, MD [id number] [place],  [place] [address] PCP: Jilda Panda, MD  Subjective: Chief Complaint  Patient presents with   Left Shoulder - Follow-up    HPI: Cynthia Obrien is a 56 y.o. female who presents to the office reporting .  Left shoulder pain.  She had MRI scan in early October.  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  No full-thickness tear.  She pulled her left arm when she was in the truck that happened several months ago.  Glenohumeral injection not much help.  Having some pain radiating to the neck but not too much conclusive radicular symptoms.  Taking Tylenol and aspirin.  Pain is not keeping her awake.              ROS: All systems reviewed are negative as they relate to the chief complaint within the history of present illness.  Patient denies fevers or chills.  Assessment & Plan: Visit Diagnoses: No diagnosis found.  Plan: Impression is left shoulder pain with predominant pain generator likely to be the glenohumeral arthritis.  She does have some partial-thickness supraspinatus tearing but good strength.  Based on the amount of arthritis present I think with the chief complaint of pain he type of rotator cuff surgery is not really indicated for pain relief.  If she develops some weakness then we could reconsider that.  For now it looks like she is heading for some type of shoulder replacement in the future.  Cautioned her against doing a lot of ballistic type of activities with that left arm as well as avoiding heavy lifting out in front of her body.  She will follow-up as needed.  Could try another glenohumeral joint injection as a last resort prior to surgery. Follow-Up Instructions: No follow-ups on file.   Orders:  No orders of the defined  types were placed in this encounter.  No orders of the defined types were placed in this encounter.     Procedures: No procedures performed   Clinical Data: No additional findings.  Objective: Vital Signs: There were no vitals taken for this visit.  Physical Exam:  Constitutional: Patient appears well-developed HEENT:  Head: Normocephalic Eyes:EOM are normal Neck: Normal range of motion Cardiovascular: Normal rate Pulmonary/chest: Effort normal Neurologic: Patient is alert Skin: Skin is warm Psychiatric: Patient has normal mood and affect  Ortho Exam: Ortho exam demonstrates pretty reasonable passive range of motion of 70/95/170.  Rotator cuff strength is 5-5 on the left to external rotation compared to 5 out of 5 on the right.  Subscap strength 5+ out of 5 bilaterally.  No Popeye deformity present in the left arm.  She does have some posterior coarseness and grinding most consistent with her MRI visible posterior shoulder glenohumeral arthritis.  No discrete tenderness on the [facility] joint.  Specialty Comments:  No specialty comments available.  Imaging: No results found.   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    Morbid obesity ([place]) 03/08/2015   GERD (gastroesophageal reflux disease) 09/22/2013   Seasonal and perennial allergic rhinitis 07/16/2013   Dyspnea 12/19/2012   Cough  10/22/2012   Joint ache    Fatigue    Psoriasis    Edema 11/04/2011   Hypothyroid 11/04/2011   SVT (supraventricular tachycardia) 10/01/2011   Hyperlipidemia 10/01/2011   Past Medical History:  Diagnosis Date   Arthritis    psoriac   COPD (chronic obstructive pulmonary disease) ([place])    Dysplastic nevus 02/13/2020   right inguinal crease, mild atypia    Fatigue    Hyperlipidemia    Hypothyroidism    Joint ache    Lower extremity edema    Psoriasis    SVT (supraventricular  tachycardia)    a. 10/2011 Echo: EF 60-65%, Gr 2 DD.   Tuberculosis    patient denies this dx    Family History  Problem Relation Age of Onset   Heart disease Mother        MV repair; ICD   Hypertension Mother    Kidney disease Mother    Skin cancer Maternal Grandmother    Stroke Father    Diabetes Father    Hypertension Father    Diabetes Sister     Past Surgical History:  Procedure Laterality Date   ANKLE SURGERY     COLONOSCOPY     ENDOMETRIAL ABLATION     LAPAROSCOPIC GASTRIC BYPASS  01/2018   LUMBAR [place] SURGERY     SHOULDER ARTHROSCOPY Left 06/24/2021   Procedure: LEFT ARTHROSCOPY SHOULDER, DEBRIDEMENT, BICEPS TENODESIS;  Surgeon: Meredith Pel, MD;  Location: [facility];  Service: Orthopedics;  Laterality: Left;   tonsillectomy     Social History   Occupational History    Comment: Corporate investment banker  Tobacco Use   Smoking status: Former    Packs/day: 1.00    Years: 16.00    Total pack years: 16.00    Types: Cigarettes    Quit date: 07/13/1997    Years since quitting: 24.8   Smokeless tobacco: Never  Vaping Use   Vaping Use: Never used  Substance and Sexual Activity   Alcohol use: Yes    Alcohol/week: 7.0 standard drinks of alcohol    Types: 7 Glasses of wine per week    Comment: 1 glass of wine daily   Drug use: Not Currently   Sexual activity: Not on file    Comment: ablation

## 2022-05-26 ENCOUNTER — Other Ambulatory Visit: Payer: Self-pay

## 2022-05-26 MED ORDER — CIMZIA 2 X 200 MG ~~LOC~~ KIT: 400.0000 mg | PACK | SUBCUTANEOUS | 6 refills | Status: AC

## 2022-05-26 NOTE — Progress Notes (Signed)
Fax received from [facility] requesting a pharmacy change for patients Cimzia from them to [id number]

## 2022-05-28 ENCOUNTER — Other Ambulatory Visit (HOSPITAL_COMMUNITY): Payer: Self-pay

## 2022-06-05 ENCOUNTER — Other Ambulatory Visit: Payer: Self-pay | Admitting: Dermatology

## 2022-06-05 DIAGNOSIS — L4050 Arthropathic psoriasis, unspecified: Secondary | ICD-10-CM

## 2022-06-10 ENCOUNTER — Other Ambulatory Visit: Payer: Self-pay | Admitting: Dermatology

## 2022-06-10 DIAGNOSIS — L719 Rosacea, unspecified: Secondary | ICD-10-CM

## 2022-06-30 ENCOUNTER — Ambulatory Visit (INDEPENDENT_AMBULATORY_CARE_PROVIDER_SITE_OTHER): Payer: BC Managed Care – PPO | Admitting: Dermatology

## 2022-06-30 DIAGNOSIS — Z79899 Other long term (current) drug therapy: Secondary | ICD-10-CM | POA: Diagnosis not present

## 2022-06-30 DIAGNOSIS — L409 Psoriasis, unspecified: Secondary | ICD-10-CM

## 2022-06-30 MED ORDER — CERTOLIZUMAB PEGOL 2 X 200 MG/ML ~~LOC~~ PSKT
400.0000 mg | PREFILLED_SYRINGE | Freq: Once | SUBCUTANEOUS | Status: AC
  Administered 2022-06-30: 400 mg via SUBCUTANEOUS

## 2022-06-30 MED ORDER — CLOBETASOL PROPIONATE 0.05 % EX OINT: TOPICAL_OINTMENT | CUTANEOUS | 2 refills | Status: AC

## 2022-06-30 NOTE — Progress Notes (Signed)
   Follow-Up Visit   Subjective  Cynthia Obrien is a 56 y.o. female who presents for the following: Psoriasis (Patient here today to start Cimzia. She has had some new spots come up at arm and leg. Patient has an old prescription of clobetasol ointment that she uses to spot treat at areas that are flared. ).  Her main complaint is the joint pain.  Cynthia Obrien helped initially but stopped working.  It works well on the skin, just not the joints.  Patient's last Taltz injections was just over 1 month ago.   The following portions of the chart were reviewed this encounter and updated as appropriate:       Review of Systems:  No other skin or systemic complaints except as noted in HPI or Assessment and Plan.  Objective  Well appearing patient in no apparent distress; mood and affect are within normal limits.  A focused examination was performed including arms, ankles. Relevant physical exam findings are noted in the Assessment and Plan.  elbows, ankles Pink scaly papule at right elbow and left ankle, xerosis and erythema at ankles    Assessment & Plan  Psoriasis elbows, ankles  Chronic and persistent condition with duration or expected duration over one year. Condition is symptomatic/ bothersome to patient. Not currently at goal.  Patient does c/o joint pain at shoulders, ankles and knees. Shoulders worse with weather changes. Fingers and toes when it's flared. Patient has had improvement when starting new biologic but eventually it fades.   Psoriasis - severe on systemic "biologic" treatment injections.  Psoriasis is a chronic non-curable, but treatable genetic/hereditary disease that may have other systemic features affecting other organ systems such as joints (Psoriatic Arthritis).  It is linked with heart disease, inflammatory bowel disease, non-alcoholic fatty liver disease, and depression. Significant skin psoriasis and/or psoriatic arthritis may have significant symptoms and affects  activities of daily activity and often benefits from systemic "biologic" injection treatments.  These "biologic" treatments have some potential side effects including immunosuppression and require pre-treatment laboratory screening and periodic laboratory monitoring and periodic in person evaluation and monitoring by the attending dermatologist physician (long term medication management).    Start Cimzia 400 mg SQ Q2weeks Cimzia 200 mg injected today by medical assistant to left abdomen Cimzia 200 mg injected today by patient to right abdomen Patient tolerated well.   Continue clobetasol 0.05% ointment 1-2 times daily to more severe areas as needed. Avoid applying to face, groin, and axilla. Use as directed. Long-term use can cause thinning of the skin.  Topical steroids (such as triamcinolone, fluocinolone, fluocinonide, mometasone, clobetasol, halobetasol, betamethasone, hydrocortisone) can cause thinning and lightening of the skin if they are used for too long in the same area. Your physician has selected the right strength medicine for your problem and area affected on the body. Please use your medication only as directed by your physician to prevent side effects.    clobetasol ointment (TEMOVATE) 0.05 % - elbows, ankles Apply 1-2 times to more severe areas as needed. Avoid applying to face, groin, and axilla. Use as directed. Long-term use can cause thinning of the skin.  Related Medications certolizumab pegol (CIMZIA) prefilled syringe 400 mg    Return in about 2 months (around 08/31/2022) for Psoriasis.  Cynthia Obrien, RMA, am acting as scribe for Cynthia Patty, MD .  Documentation: I have reviewed the above documentation for accuracy and completeness, and I agree with the above.  Cynthia Patty MD

## 2022-06-30 NOTE — Patient Instructions (Signed)
Reviewed risks of biologics including immunosuppression, infections, injection site reaction, and failure to improve condition. Goal is control of skin condition, not cure.  Some older biologics such as Humira and Enbrel may slightly increase risk of malignancy and may worsen congestive heart failure.  Talz and Cosentyx may cause inflammatory bowel disease to flare. The use of biologics requires long term medication management, including periodic office visits and monitoring of blood work.   Due to recent changes in healthcare laws, you may see results of your pathology and/or laboratory studies on MyChart before the doctors have had a chance to review them. We understand that in some cases there may be results that are confusing or concerning to you. Please understand that not all results are received at the same time and often the doctors may need to interpret multiple results in order to provide you with the best plan of care or course of treatment. Therefore, we ask that you please give us 2 business days to thoroughly review all your results before contacting the office for clarification. Should we see a critical lab result, you will be contacted sooner.   If You Need Anything After Your Visit  If you have any questions or concerns for your doctor, please call our main line at [phone] and press option 4 to reach your doctor's medical assistant. If no one answers, please leave a voicemail as directed and we will return your call as soon as possible. Messages left after 4 pm will be answered the following business day.   You may also send us a message via MyChart. We typically respond to MyChart messages within 1-2 business days.  For prescription refills, please ask your pharmacy to contact our office. Our fax number is [phone].  If you have an urgent issue when the clinic is closed that cannot wait until the next business day, you can page your doctor at the number below.    Please  note that while we do our best to be available for urgent issues outside of office hours, we are not available 24/7.   If you have an urgent issue and are unable to reach us, you may choose to seek medical care at your doctor's office, retail clinic, urgent care center, or emergency room.  If you have a medical emergency, please immediately call 911 or go to the emergency department.  Pager Numbers  - Dr. Kowalski: [phone]  - Dr. Moye: [phone]  - Dr. Stewart: [phone]  In the event of inclement weather, please call our main line at [phone] for an update on the status of any delays or closures.  Dermatology Medication Tips: Please keep the boxes that topical medications come in in order to help keep track of the instructions about where and how to use these. Pharmacies typically print the medication instructions only on the boxes and not directly on the medication tubes.   If your medication is too expensive, please contact our office at [phone] option 4 or send us a message through MyChart.   We are unable to tell what your co-pay for medications will be in advance as this is different depending on your insurance coverage. However, we may be able to find a substitute medication at lower cost or fill out paperwork to get insurance to cover a needed medication.   If a prior authorization is required to get your medication covered by your insurance company, please allow us 1-2 business days to complete this process.  Drug prices often vary   depending on where the prescription is filled and some pharmacies may offer cheaper prices.  The website www.goodrx.com contains coupons for medications through different pharmacies. The prices here do not account for what the cost may be with help from insurance (it may be cheaper with your insurance), but the website can give you the price if you did not use any insurance.  - You can print the associated coupon and take it with  your prescription to the pharmacy.  - You may also stop by our office during regular business hours and pick up a GoodRx coupon card.  - If you need your prescription sent electronically to a different pharmacy, notify our office through [facility] MyChart or by phone at [phone] option 4.     Si Usted Necesita Algo Despus de Su Visita  Tambin puede enviarnos un mensaje a travs de MyChart. Por lo general respondemos a los mensajes de MyChart en el transcurso de 1 a 2 das hbiles.  Para renovar recetas, por favor pida a su farmacia que se ponga en contacto con nuestra oficina. Nuestro nmero de fax es el [phone].  Si tiene un asunto urgente cuando la clnica est cerrada y que no puede esperar hasta el siguiente da hbil, puede llamar/localizar a su doctor(a) al nmero que aparece a continuacin.   Por favor, tenga en cuenta que aunque hacemos todo lo posible para estar disponibles para asuntos urgentes fuera del horario de oficina, no estamos disponibles las 24 horas del da, los 7 das de la semana.   Si tiene un problema urgente y no puede comunicarse con nosotros, puede optar por buscar atencin mdica  en el consultorio de su doctor(a), en una clnica privada, en un centro de atencin urgente o en una sala de emergencias.  Si tiene una emergencia mdica, por favor llame inmediatamente al 911 o vaya a la sala de emergencias.  Nmeros de bper  - Dr. Kowalski: [phone]  - Dra. Moye: [phone]  - Dra. Stewart: [phone]  En caso de inclemencias del tiempo, por favor llame a nuestra lnea principal al [phone] para una actualizacin sobre el estado de cualquier retraso o cierre.  Consejos para la medicacin en dermatologa: Por favor, guarde las cajas en las que vienen los medicamentos de uso tpico para ayudarle a seguir las instrucciones sobre dnde y cmo usarlos. Las farmacias generalmente imprimen las instrucciones del medicamento slo en las cajas y  no directamente en los tubos del medicamento.   Si su medicamento es muy caro, por favor, pngase en contacto con nuestra oficina llamando al [phone] y presione la opcin 4 o envenos un mensaje a travs de MyChart.   No podemos decirle cul ser su copago por los medicamentos por adelantado ya que esto es diferente dependiendo de la cobertura de su seguro. Sin embargo, es posible que podamos encontrar un medicamento sustituto a menor costo o llenar un formulario para que el seguro cubra el medicamento que se considera necesario.   Si se requiere una autorizacin previa para que su compaa de seguros cubra su medicamento, por favor permtanos de 1 a 2 das hbiles para completar este proceso.  Los precios de los medicamentos varan con frecuencia dependiendo del lugar de dnde se surte la receta y alguna farmacias pueden ofrecer precios ms baratos.  El sitio web www.goodrx.com tiene cupones para medicamentos de diferentes farmacias. Los precios aqu no tienen en cuenta lo que podra costar con la ayuda del seguro (puede ser ms barato con su seguro),   pero el sitio web puede darle el precio si no utiliz ningn seguro.  - Puede imprimir el cupn correspondiente y llevarlo con su receta a la farmacia.  - Tambin puede pasar por nuestra oficina durante el horario de atencin regular y recoger una tarjeta de cupones de GoodRx.  - Si necesita que su receta se enve electrnicamente a una farmacia diferente, informe a nuestra oficina a travs de MyChart de [place] o por telfono llamando al [phone] y presione la opcin 4.  

## 2022-07-14 ENCOUNTER — Encounter: Payer: Self-pay | Admitting: Cardiology

## 2022-07-15 MED ORDER — DILTIAZEM HCL ER COATED BEADS 120 MG PO CP24: 120.0000 mg | ORAL_CAPSULE | Freq: Every day | ORAL | 3 refills | Status: AC

## 2022-07-15 NOTE — Addendum Note (Signed)
Addended by: Sharee Holster on: 07/15/2022 01:54 PM   Modules accepted: Orders

## 2022-09-01 ENCOUNTER — Encounter: Payer: Self-pay | Admitting: Cardiology

## 2022-09-08 ENCOUNTER — Ambulatory Visit: Payer: BC Managed Care – PPO | Admitting: Dermatology

## 2022-11-02 ENCOUNTER — Ambulatory Visit (INDEPENDENT_AMBULATORY_CARE_PROVIDER_SITE_OTHER): Payer: BC Managed Care – PPO | Admitting: Dermatology

## 2022-11-02 VITALS — BP 137/85

## 2022-11-02 DIAGNOSIS — Z79899 Other long term (current) drug therapy: Secondary | ICD-10-CM | POA: Diagnosis not present

## 2022-11-02 DIAGNOSIS — L409 Psoriasis, unspecified: Secondary | ICD-10-CM | POA: Diagnosis not present

## 2022-11-02 MED ORDER — BIMZELX 160 MG/ML ~~LOC~~ SOAJ: 2.0000 mL | SUBCUTANEOUS | 6 refills | Status: DC

## 2022-11-02 MED ORDER — VTAMA 1 % EX CREA: 1.0000 | TOPICAL_CREAM | Freq: Every day | CUTANEOUS | 4 refills | Status: AC

## 2022-11-02 NOTE — Progress Notes (Signed)
   Follow-Up Visit   Subjective  Cynthia Obrien is a 57 y.o. female who presents for the following: Psoriasis legs, f/u, Cimzia  sq injections q 2 wks, not seeing improvement in skin, but joints feel better in hands/feet, breaking out on legs.  Clobetasol oint ~2x/wk, previous biologics Taltz, Cosentyx, Skyrizi, also Humira and Enbrel and Otezla. no hx of IBS or crohns disease    The following portions of the chart were reviewed this encounter and updated as appropriate: medications, allergies, medical history  Review of Systems:  No other skin or systemic complaints except as noted in HPI or Assessment and Plan.  Objective  Well appearing patient in no apparent distress; mood and affect are within normal limits.  Areas Examined: legs  Relevant exam findings are noted in the Assessment and Plan.      Assessment & Plan     PSORIASIS Pink scaly paps legs 2% BSA on treatment (Cimzia)  Chronic and persistent condition with duration or expected duration over one year. Condition is bothersome/symptomatic for patient. Currently flared.   Joint pain fingers, toes improved on Cimzia  Treatment Plan: Labs from 05/01/22 and 05/14/22 viewed Discussed switching to Bimzelx injections Pt will finish Cimzia injections she has at home then she will d/c After d/c Cimzia, Start Bimzelx sq injections q month x 5 months, then q 2 months for maintenance.  Start Vtama cr qd to aa psoriasis on legs, sample x 1 given, NJ6R,  Cont Clobetasol oint qd up to 5d/wk aa legs prn flares, avoid f/g/a   Counseling and coordination of care for severe psoriasis on systemic treatment  psoriasis - severe on systemic treatment.  Psoriasis is a chronic non-curable, but treatable genetic/hereditary disease that may have other systemic features affecting other organ systems such as joints (Psoriatic Arthritis).  It is linked with heart disease, inflammatory bowel disease, non-alcoholic fatty liver  disease, and depression. Significant skin psoriasis and/or psoriatic arthritis may have significant symptoms and affects activities of daily activity and often benefits from systemic treatments.  These systemic treatments have some potential side effects including immunosuppression and require pre-treatment laboratory screening and periodic laboratory monitoring and periodic in person evaluation and monitoring by the attending dermatologist physician (long term medication management).   Reviewed risks of biologics including immunosuppression, infections, injection site reaction, and failure to improve condition. Goal is control of skin condition, not cure.  Some older biologics such as Humira and Enbrel may slightly increase risk of malignancy and may worsen congestive heart failure.  Taltz and Cosentyx may cause inflammatory bowel disease to flare. The use of biologics requires long term medication management, including periodic office visits and monitoring of blood work.   Return for pt is moving and will find a dermatologist in [place].  I, Ardis Rowan, RMA, am acting as scribe for Willeen Niece, MD .   Documentation: I have reviewed the above documentation for accuracy and completeness, and I agree with the above.  Willeen Niece, MD

## 2022-11-02 NOTE — Patient Instructions (Addendum)
Your prescription, Cynthia Obrien, was sent to [facility] in [place], [place]. You will receive a text message from Cynthia Obrien. Clink the link to confirm your information. Enrollment only takes 2 minutes. Confirm your insurance. You will get the lowest possible price based on your coverage. Confirm your payment and delivery information. After your co-pay is verified, you will receive a text with a shipment tracking link. If for any reason you do not receive a text message from them, please reach out to them. Their phone number is [phone].       Due to recent changes in healthcare laws, you may see results of your pathology and/or laboratory studies on MyChart before the doctors have had a chance to review them. We understand that in some cases there may be results that are confusing or concerning to you. Please understand that not all results are received at the same time and often the doctors may need to interpret multiple results in order to provide you with the best plan of care or course of treatment. Therefore, we ask that you please give [place] 2 business days to thoroughly review all your results before contacting the office for clarification. Should we see a critical lab result, you will be contacted sooner.   If You Need Anything After Your Visit  If you have any questions or concerns for your doctor, please call our main line at [phone] and press option 4 to reach your doctor's medical assistant. If no one answers, please leave a voicemail as directed and we will return your call as soon as possible. Messages left after 4 pm will be answered the following business day.   You may also send [place] a message via MyChart. We typically respond to MyChart messages within 1-2 business days.  For prescription refills, please ask your pharmacy to contact our office. Our fax number is [phone].  If you have an urgent issue when the clinic is closed that cannot wait until the next business day, you can  page your doctor at the number below.    Please note that while we do our best to be available for urgent issues outside of office hours, we are not available 24/7.   If you have an urgent issue and are unable to reach [place], you may choose to seek medical care at your doctor's office, retail clinic, urgent care center, or emergency room.  If you have a medical emergency, please immediately call 911 or go to the emergency department.  Pager Numbers  - Dr. Gwen Obrien: [phone]  - Dr. Neale Obrien: [phone]  - Dr. Roseanne Obrien: [phone]  In the event of inclement weather, please call our main line at [phone] for an update on the status of any delays or closures.  Dermatology Medication Tips: Please keep the boxes that topical medications come in in order to help keep track of the instructions about where and how to use these. Pharmacies typically print the medication instructions only on the boxes and not directly on the medication tubes.   If your medication is too expensive, please contact our office at [phone] option 4 or send [place] a message through MyChart.   We are unable to tell what your co-pay for medications will be in advance as this is different depending on your insurance coverage. However, we may be able to find a substitute medication at lower cost or fill out paperwork to get insurance to cover a needed medication.   If a prior authorization is required to get your medication  covered by your insurance company, please allow [place] 1-2 business days to complete this process.  Drug prices often vary depending on where the prescription is filled and some pharmacies may offer cheaper prices.  The website www.goodrx.com contains coupons for medications through different pharmacies. The prices here do not account for what the cost may be with help from insurance (it may be cheaper with your insurance), but the website can give you the price if you did not use any insurance.  - You  can print the associated coupon and take it with your prescription to the pharmacy.  - You may also stop by our office during regular business hours and pick up a GoodRx coupon card.  - If you need your prescription sent electronically to a different pharmacy, notify our office through [facility] or by phone at [phone] option 4.     Si Usted Necesita Algo Despus de Su Visita  Tambin puede enviarnos un mensaje a travs de Pharmacist, community. Por lo general respondemos a los mensajes de MyChart en el transcurso de 1 a 2 das hbiles.  Para renovar recetas, por favor pida a su farmacia que se ponga en contacto con nuestra oficina. Cynthia Obrien de fax es [place] [phone].  Si tiene un asunto urgente cuando la clnica est cerrada y que no puede esperar hasta el siguiente da hbil, puede llamar/localizar a su doctor(a) al nmero que aparece a continuacin.   Por favor, tenga en cuenta que aunque hacemos todo lo posible para estar disponibles para asuntos urgentes fuera del horario de [place], no estamos disponibles las 24 horas del da, los 7 das de la [place].   Si tiene un problema urgente y no puede comunicarse con nosotros, puede optar por buscar atencin mdica  en el consultorio de su doctor(a), en una clnica privada, en un centro de atencin urgente o en una sala de emergencias.  Si tiene Engineering geologist, por favor llame inmediatamente al 911 o vaya a la sala de emergencias.  Nmeros de bper  - Dr. Nehemiah Obrien: [phone]  - Dra. Cynthia Obrien: [phone]  - Dra. Cynthia Obrien: [phone]  En caso de inclemencias del [place], por favor llame a Cynthia Obrien principal al [phone] para una actualizacin sobre el [place] de cualquier retraso o cierre.  Consejos para la medicacin en dermatologa: Por favor, guarde las cajas en las que vienen los medicamentos de uso tpico para ayudarle a seguir las instrucciones sobre dnde y cmo usarlos. Las farmacias generalmente imprimen las  instrucciones del medicamento slo en las cajas y no directamente en los tubos del [place].   Si su medicamento es muy caro, por favor, pngase en contacto con Zigmund Daniel llamando al [phone] y presione la opcin 4 o envenos un mensaje a travs de Pharmacist, community.   No podemos decirle cul ser su copago por los medicamentos por adelantado ya que esto es diferente dependiendo de la cobertura de su seguro. Sin embargo, es posible que podamos encontrar un medicamento sustituto a Electrical engineer un formulario para que el seguro cubra el medicamento que se considera necesario.   Si se requiere una autorizacin previa para que su compaa de seguros [place] su medicamento, por favor permtanos de 1 a 2 das hbiles para completar este proceso.  Los precios de los medicamentos varan con frecuencia dependiendo del Environmental consultant de dnde se surte la receta y alguna farmacias pueden ofrecer precios ms baratos.  El sitio web www.goodrx.com tiene cupones para medicamentos de Airline pilot. Los precios aqu  no tienen en cuenta lo que podra costar con la ayuda del seguro (puede ser ms barato con su seguro), pero el sitio web puede darle el precio si no Field seismologist.  - Puede imprimir el cupn correspondiente y llevarlo con su receta a la farmacia.  - Tambin puede pasar por nuestra oficina durante el horario de atencin regular y Charity fundraiser una tarjeta de cupones de GoodRx.  - Si necesita que su receta se enve electrnicamente a una farmacia diferente, informe a nuestra oficina a travs de MyChart de [place] o por telfono llamando al [phone] y presione la opcin 4.

## 2022-11-19 ENCOUNTER — Encounter: Payer: Self-pay | Admitting: Cardiology

## 2022-11-24 ENCOUNTER — Telehealth: Payer: Self-pay

## 2022-11-24 NOTE — Telephone Encounter (Signed)
Finacea Foam not covered by insurance.   Patient must try and fail Azelaic acid gel, metronidazole 0.75 cream, metronidazole  0.75 or 1% gel, metronidazole 0.75% lotion or rosadan cream or gel.

## 2022-11-25 MED ORDER — AZELAIC ACID 15 % EX GEL: 1.0000 | Freq: Two times a day (BID) | CUTANEOUS | 2 refills | Status: AC

## 2022-11-25 NOTE — Telephone Encounter (Signed)
Patient advised and RX sent in. aw 

## 2022-12-08 ENCOUNTER — Other Ambulatory Visit: Payer: Self-pay

## 2022-12-08 MED ORDER — BIMZELX 160 MG/ML ~~LOC~~ SOAJ: 2.0000 mL | SUBCUTANEOUS | 6 refills | Status: AC

## 2022-12-08 NOTE — Progress Notes (Signed)
Pharmacy change request faxed to [place] from [facility] requesting change from them to [facility]. Escripted

## 2022-12-18 IMAGING — XA DG FLUORO GUIDE NDL PLC/BX
1 series · 1 of 1 positions shown · IV contrast (multihance)
Comparison: none

CLINICAL DATA: Left shoulder pain.

EXAM:
LEFT SHOULDER INJECTION UNDER FLUOROSCOPY
TECHNIQUE: An appropriate skin entrance site was determined. The site was
marked, prepped with Betadine, draped in the usual sterile fashion,
and infiltrated locally with 1% lidocaine. A 22 gauge spinal needle
was advanced to the superomedial margin of the humeral head under
intermittent fluoroscopy. 1 mL of 1% lidocaine injected easily. A
mixture of 0.1 mL of MultiHance, 15 mL of Isovue-M 200, and 5 mL of
sterile saline was then used to opacify the left shoulder capsule.
12 mL of this mixture were injected. No immediate complication.
FLUOROSCOPY TIME:  Fluoroscopy Time:  6 seconds
Radiation Exposure Index (if provided by the fluoroscopic device):
2.87 microGray*m^2
Number of Acquired Spot Images: 0

[Series 1: ortho standard · 1 of 1 slices shown]
[im 1/1]
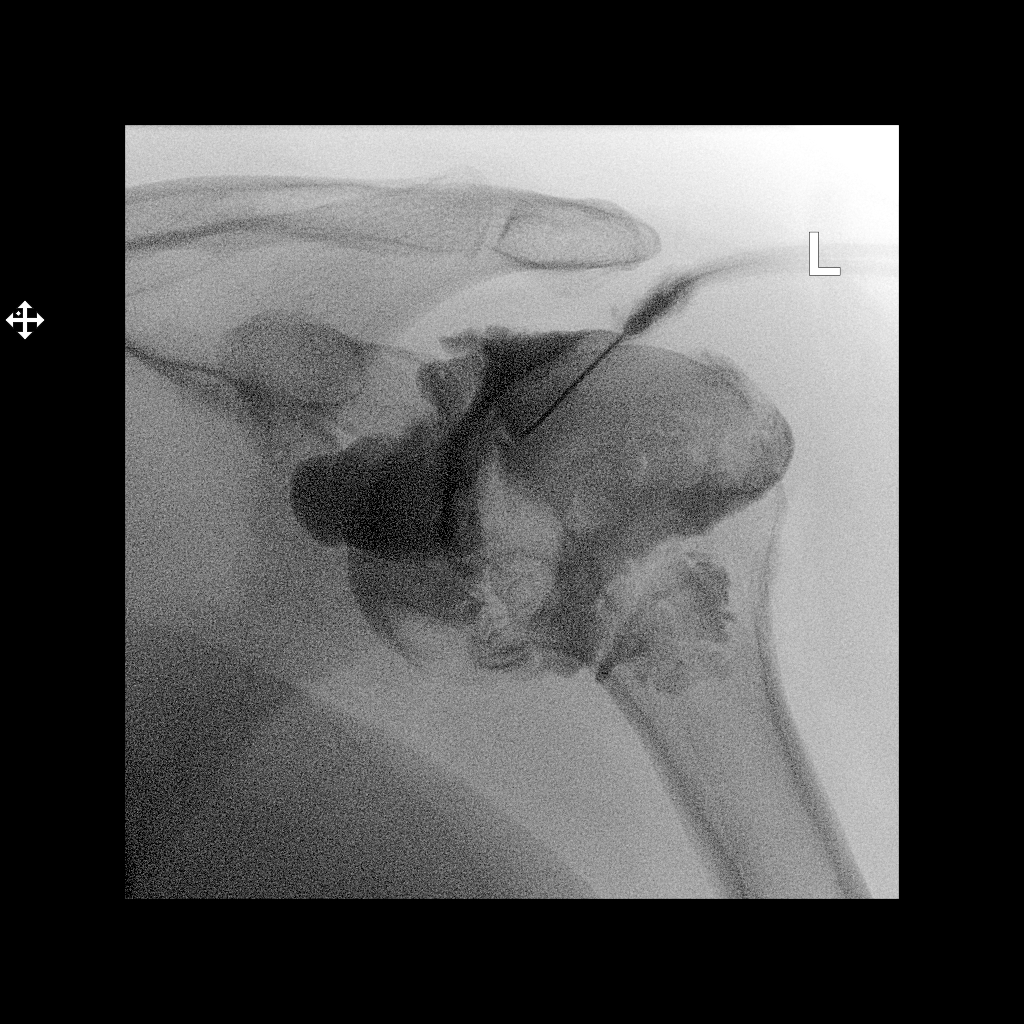

[1 of 1 positions shown; findings below may reference images not displayed]

IMPRESSION: Technically successful left shoulder injection for MRI.

## 2022-12-18 IMAGING — MR MR SHOULDER*L* W/ CM
4 of 7 series · 13 of 40 positions shown · IV contrast (agent unspecified)
Comparison: None.

CLINICAL DATA: Left shoulder pain and biceps pain for 2 years

EXAM:
MR ARTHROGRAM OF THE LEFT SHOULDER
TECHNIQUE: Multiplanar, multisequence MR imaging of the left shoulder was
performed following the administration of intra-articular contrast.
CONTRAST:  See Injection Documentation.

[Series 7: T1 fat-sat · axial · left · 3.0mm · 0.36mm/px · z∈[-49,+19]mm · 3 of 28 slices shown (1 of 2)]
[im 4/28]
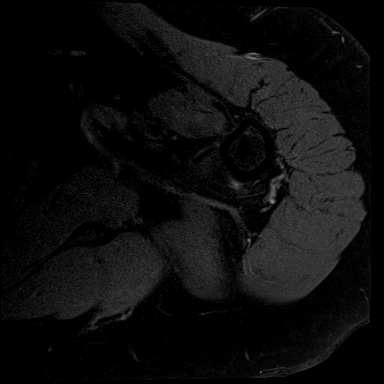
[im 16/28]
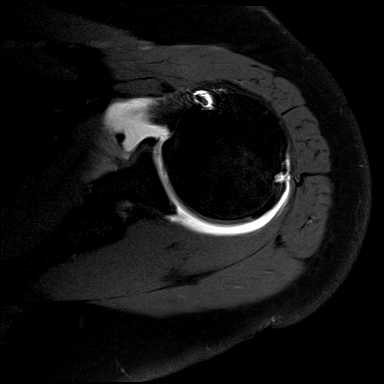
[im 24/28]
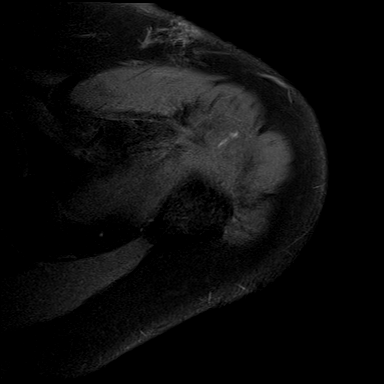

[Series 8: T2 fat-sat · sagittal · left · 3.0mm · 0.22mm/px · 4 of 25 slices shown (1 of 2)]
[im 1/25]
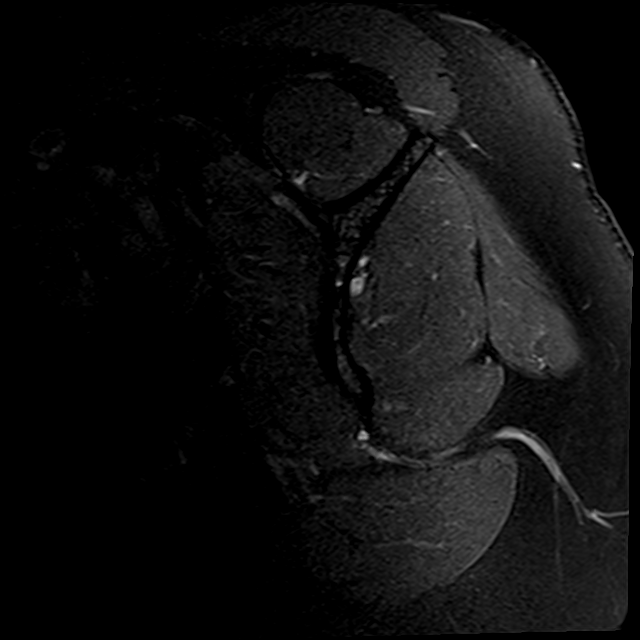
[im 5/25]
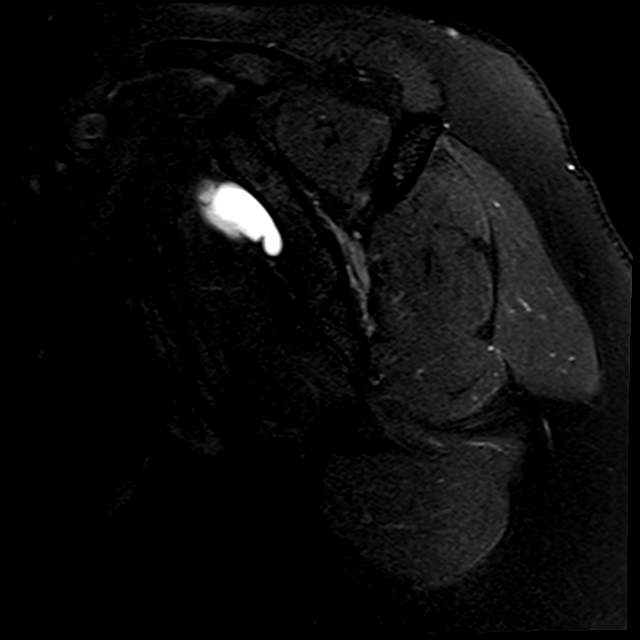
[im 15/25]
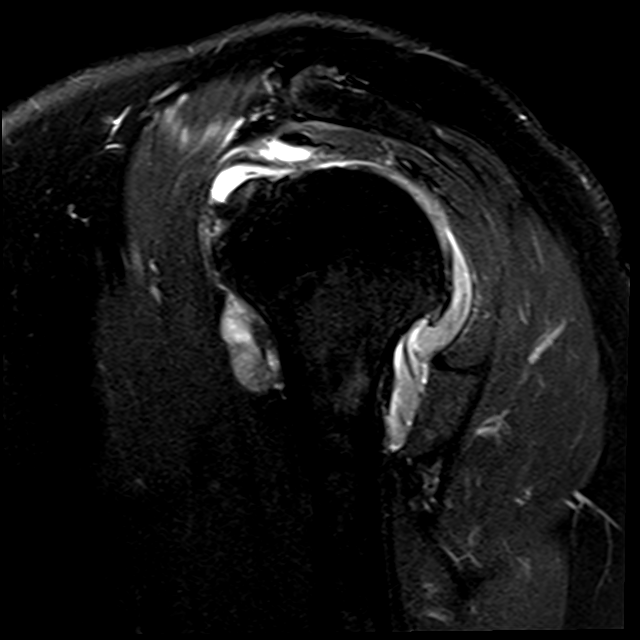
[im 25/25]
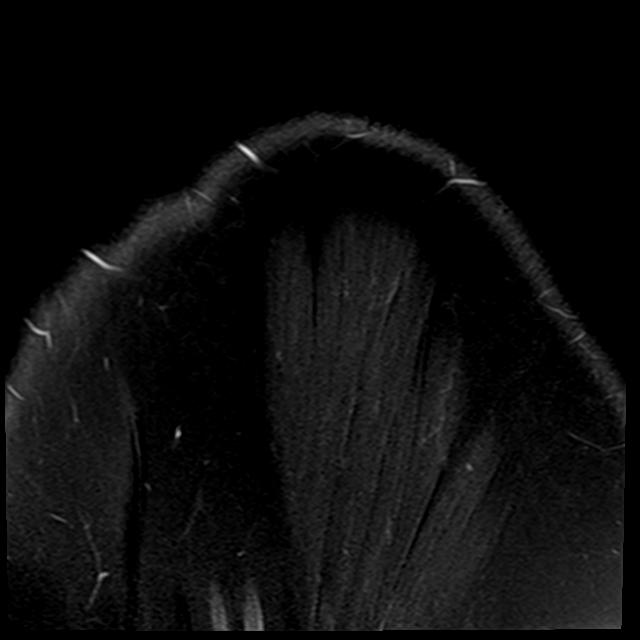

[Series 9: T1 fat-sat · oblique · left · 3.0mm · 0.18mm/px · 3 of 25 slices shown (2 of 2)]
[im 5/25]
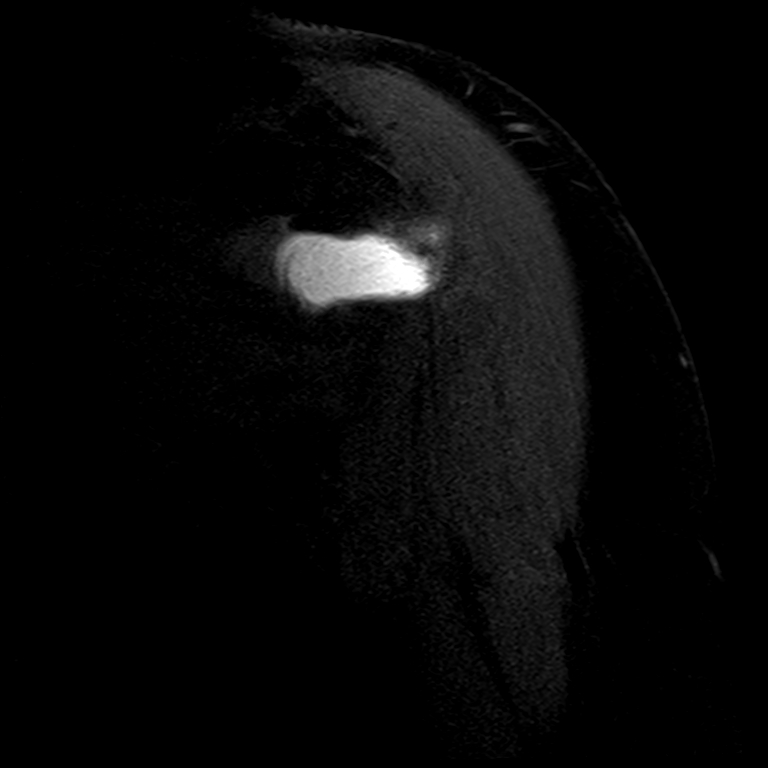
[im 15/25]
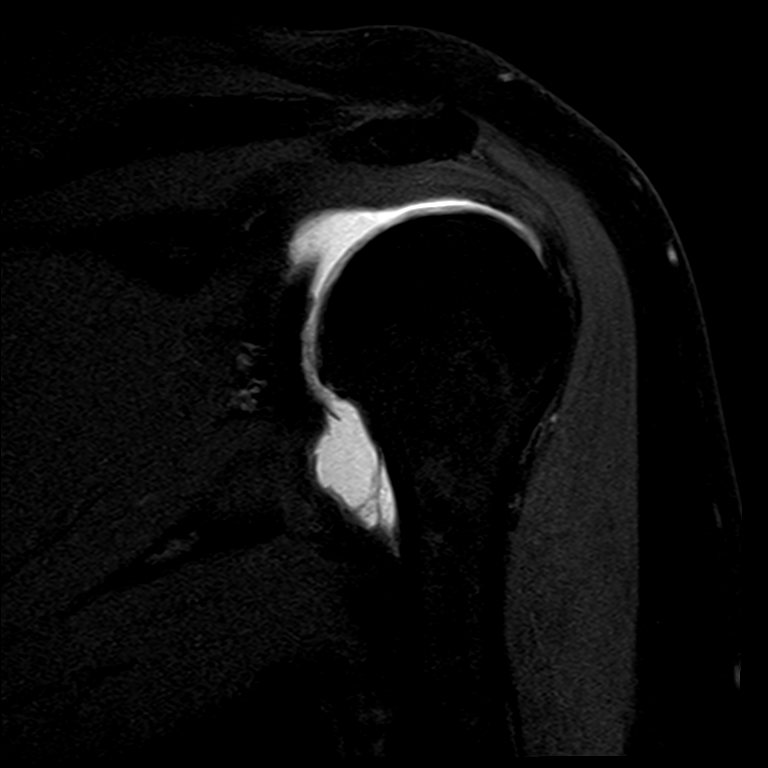
[im 25/25]
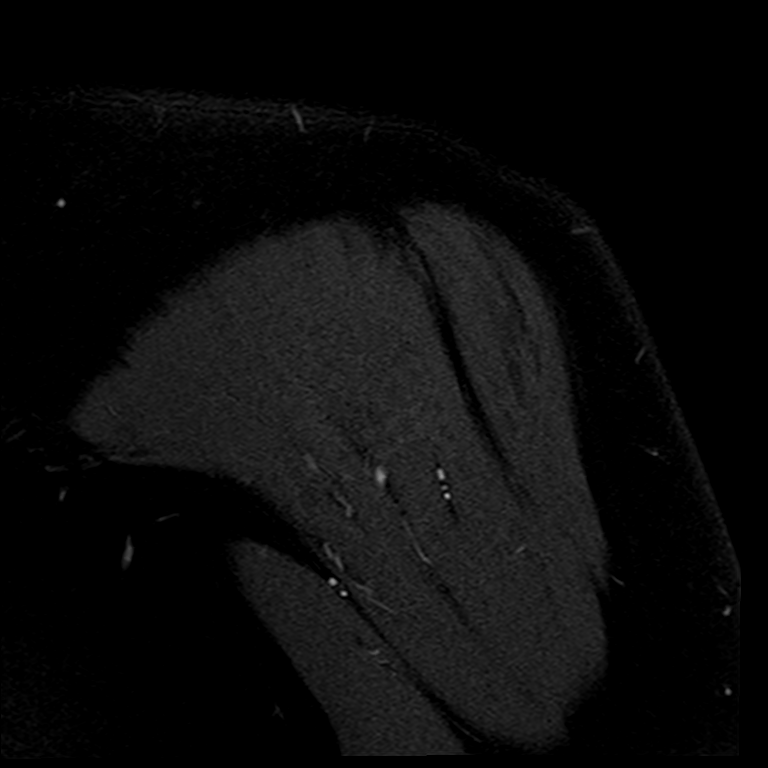

[Series 11: T2 fat-sat · oblique · left · 3.0mm · 0.22mm/px · 3 of 25 slices shown (2 of 2)]
[im 5/25]
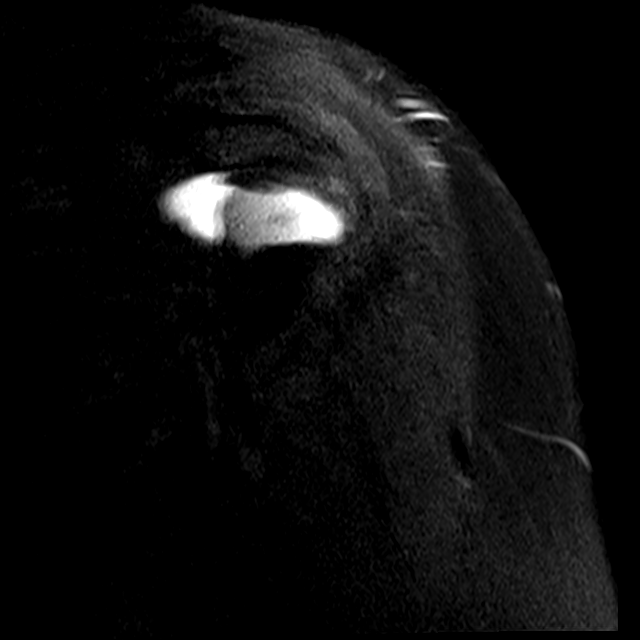
[im 15/25]
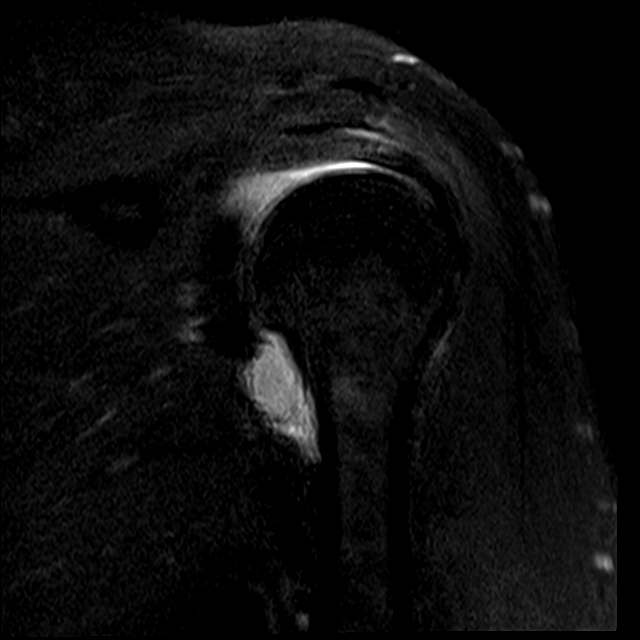
[im 25/25]
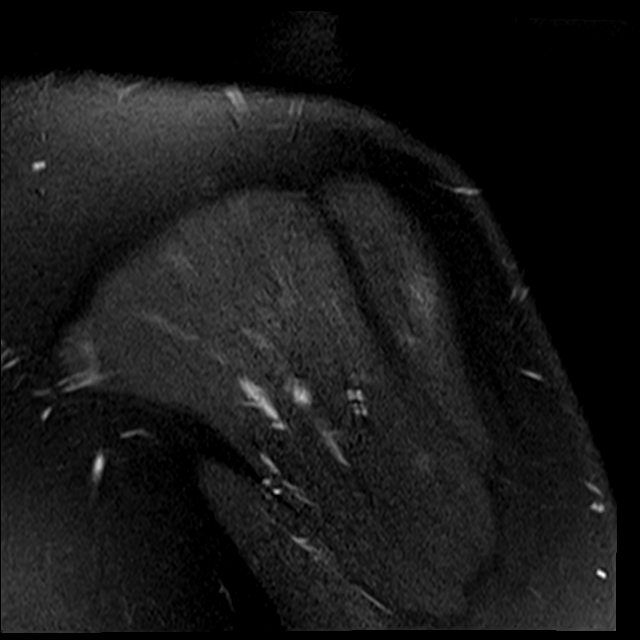

[13 of 40 positions shown; findings below may reference images not displayed]

FINDINGS: Rotator cuff: Mild tendinosis of the supraspinatus tendon with a
partial-thickness articular surface tear with an interstitial
component. Mild tendinosis of the infraspinatus tendon. Teres minor
tendon is intact. Subscapularis tendon is intact.

Muscles: No muscle atrophy or edema. No intramuscular fluid
collection or hematoma.

Biceps Long Head: Moderate tendinosis of the intra-articular portion
of the long head of the biceps tendon.

Acromioclavicular Joint: Moderate arthropathy of the
acromioclavicular joint. No subacromial/subdeltoid bursal fluid.

Glenohumeral Joint: Intraarticular contrast distending the joint
capsule. Normal glenohumeral ligaments. High-grade partial-thickness
cartilage loss of the glenohumeral ligament.

Labrum: Degeneration of the superior labrum and posterior labrum.

Bones: No fracture or dislocation. No aggressive osseous lesion.

Other: No fluid collection or hematoma.
IMPRESSION: 1. Mild tendinosis of the supraspinatus tendon with a
partial-thickness articular surface tear with an interstitial
component.
2. Mild tendinosis of the infraspinatus tendon.
3. Moderate tendinosis of the intra-articular portion of the long
head of the biceps tendon.
4. High-grade partial-thickness cartilage loss of the glenohumeral
ligament.
